# Patient Record
Sex: Female | Born: 1960 | Race: White | Hispanic: No | Marital: Married | Smoking: Never smoker
Health system: Southern US, Community
[De-identification: ages and names within clinical notes are randomized; demographics above are authoritative.]

## PROBLEM LIST (undated history)

## (undated) DIAGNOSIS — J302 Other seasonal allergic rhinitis: Secondary | ICD-10-CM

## (undated) DIAGNOSIS — C50919 Malignant neoplasm of unspecified site of unspecified female breast: Secondary | ICD-10-CM

## (undated) DIAGNOSIS — G43909 Migraine, unspecified, not intractable, without status migrainosus: Secondary | ICD-10-CM

## (undated) DIAGNOSIS — F419 Anxiety disorder, unspecified: Secondary | ICD-10-CM

## (undated) DIAGNOSIS — D219 Benign neoplasm of connective and other soft tissue, unspecified: Secondary | ICD-10-CM

## (undated) DIAGNOSIS — I2699 Other pulmonary embolism without acute cor pulmonale: Secondary | ICD-10-CM

## (undated) DIAGNOSIS — I82409 Acute embolism and thrombosis of unspecified deep veins of unspecified lower extremity: Secondary | ICD-10-CM

## (undated) DIAGNOSIS — F32A Depression, unspecified: Secondary | ICD-10-CM

## (undated) DIAGNOSIS — M1990 Unspecified osteoarthritis, unspecified site: Secondary | ICD-10-CM

## (undated) DIAGNOSIS — N200 Calculus of kidney: Secondary | ICD-10-CM

## (undated) DIAGNOSIS — D649 Anemia, unspecified: Secondary | ICD-10-CM

## (undated) HISTORY — DX: Anemia, unspecified: D64.9

## (undated) HISTORY — PX: WISDOM TOOTH EXTRACTION: SHX21

## (undated) HISTORY — DX: Other pulmonary embolism without acute cor pulmonale: I26.99

## (undated) HISTORY — DX: Acute embolism and thrombosis of unspecified deep veins of unspecified lower extremity: I82.409

## (undated) HISTORY — PX: TUBAL LIGATION: SHX77

## (undated) HISTORY — DX: Migraine, unspecified, not intractable, without status migrainosus: G43.909

## (undated) HISTORY — DX: Malignant neoplasm of unspecified site of unspecified female breast: C50.919

## (undated) HISTORY — DX: Benign neoplasm of connective and other soft tissue, unspecified: D21.9

## (undated) HISTORY — DX: Depression, unspecified: F32.A

## (undated) HISTORY — DX: Anxiety disorder, unspecified: F41.9

---

## 1987-07-09 DIAGNOSIS — I2699 Other pulmonary embolism without acute cor pulmonale: Secondary | ICD-10-CM

## 1987-07-09 HISTORY — DX: Other pulmonary embolism without acute cor pulmonale: I26.99

## 2010-04-19 ENCOUNTER — Other Ambulatory Visit: Admission: RE | Admit: 2010-04-19 | Discharge: 2010-04-19 | Payer: Self-pay | Admitting: Gynecology

## 2010-04-19 ENCOUNTER — Ambulatory Visit: Payer: Self-pay | Admitting: Gynecology

## 2010-04-26 ENCOUNTER — Ambulatory Visit: Payer: Self-pay | Admitting: Gynecology

## 2010-05-17 ENCOUNTER — Ambulatory Visit: Payer: Self-pay | Admitting: Gynecology

## 2010-05-17 ENCOUNTER — Ambulatory Visit (HOSPITAL_BASED_OUTPATIENT_CLINIC_OR_DEPARTMENT_OTHER): Admission: RE | Admit: 2010-05-17 | Discharge: 2010-05-17 | Payer: Self-pay | Admitting: Gynecology

## 2010-09-18 LAB — POCT HEMOGLOBIN-HEMACUE: Hemoglobin: 10.5 g/dL — ABNORMAL LOW (ref 12.0–15.0)

## 2011-12-30 ENCOUNTER — Ambulatory Visit (INDEPENDENT_AMBULATORY_CARE_PROVIDER_SITE_OTHER): Payer: Managed Care, Other (non HMO) | Admitting: Gynecology

## 2011-12-30 ENCOUNTER — Encounter: Payer: Self-pay | Admitting: Gynecology

## 2011-12-30 ENCOUNTER — Other Ambulatory Visit (HOSPITAL_COMMUNITY)
Admission: RE | Admit: 2011-12-30 | Discharge: 2011-12-30 | Disposition: A | Discharge status: Home / Self Care | Payer: Managed Care, Other (non HMO) | Source: Ambulatory Visit | Attending: Gynecology | Admitting: Gynecology

## 2011-12-30 VITALS — BP 120/60 | Ht 64.0 in | Wt 173.0 lb

## 2011-12-30 DIAGNOSIS — Z13220 Encounter for screening for lipoid disorders: Secondary | ICD-10-CM

## 2011-12-30 DIAGNOSIS — Z01419 Encounter for gynecological examination (general) (routine) without abnormal findings: Secondary | ICD-10-CM

## 2011-12-30 DIAGNOSIS — N840 Polyp of corpus uteri: Secondary | ICD-10-CM | POA: Insufficient documentation

## 2011-12-30 DIAGNOSIS — G43909 Migraine, unspecified, not intractable, without status migrainosus: Secondary | ICD-10-CM | POA: Insufficient documentation

## 2011-12-30 DIAGNOSIS — Z131 Encounter for screening for diabetes mellitus: Secondary | ICD-10-CM

## 2011-12-30 DIAGNOSIS — Z1159 Encounter for screening for other viral diseases: Secondary | ICD-10-CM | POA: Insufficient documentation

## 2011-12-30 DIAGNOSIS — N920 Excessive and frequent menstruation with regular cycle: Secondary | ICD-10-CM

## 2011-12-30 DIAGNOSIS — D259 Leiomyoma of uterus, unspecified: Secondary | ICD-10-CM

## 2011-12-30 LAB — CBC WITH DIFFERENTIAL/PLATELET
Basophils Relative: 1 % (ref 0–1)
HCT: 31 % — ABNORMAL LOW (ref 36.0–46.0)
Hemoglobin: 9.1 g/dL — ABNORMAL LOW (ref 12.0–15.0)
Lymphs Abs: 1.5 10*3/uL (ref 0.7–4.0)
MCHC: 29.4 g/dL — ABNORMAL LOW (ref 30.0–36.0)
Monocytes Absolute: 0.4 10*3/uL (ref 0.1–1.0)
Monocytes Relative: 7 % (ref 3–12)
Neutro Abs: 4.6 10*3/uL (ref 1.7–7.7)
RBC: 4.66 MIL/uL (ref 3.87–5.11)

## 2011-12-30 LAB — LIPID PANEL
Cholesterol: 177 mg/dL (ref 0–200)
HDL: 46 mg/dL (ref >39)
Total CHOL/HDL Ratio: 3.8 Ratio
Triglycerides: 119 mg/dL (ref <150)
VLDL: 24 mg/dL (ref 0–40)

## 2011-12-30 NOTE — Progress Notes (Signed)
TARISHA FADER 31-Jan-1961 [id number]        51 y.o.  for annual exam.  Several issues noted below.  Past medical history,surgical history, medications, allergies, family history and social history were all reviewed and documented in the EPIC chart. ROS:  Was performed and pertinent positives and negatives are included in the history.  Exam: Kim Asst. present Filed Vitals:   12/30/11 1102  BP: 120/60   General appearance  Normal Skin grossly normal Head/Neck normal with no cervical or supraclavicular adenopathy thyroid normal Lungs  clear Cardiac RR, without RMG Abdominal  soft, nontender, without masses, organomegaly or hernia Breasts  examined lying and sitting without masses, retractions, discharge or axillary adenopathy. Pelvic  Ext/BUS/vagina  normal   Cervix  normal   Uterus  Bulky irregular midline and mobile nontender   Adnexa  Without masses or tenderness    Anus and perineum  normal   Rectovaginal  normal sphincter tone without palpated masses or tenderness.    Assessment/Plan:  51 y.o. female for annual exam.    1. Leiomyoma. Uterus appears to be larger than previously on exam. Repeat ultrasound now for comparison to her ultrasound in 2011. Menorrhagia continues. Options for management reviewed to include trial of Depo-Lupron given her age and hopefully getting her through menopause. Alternatives such as endometrial ablation up to and including hysterectomy reviewed. Literature on HerOption endometrial ablation given.  Patient will follow for sonohysterogram and we'll go from there. 2. Anemia. Patient has a history of significant iron deficiency anemia. She is on iron several times weekly. We'll check CBC now. 3. Pap smear. Pap/HPV done today. No history of abnormal Pap smears before but only Pap smear from 2011 in her chart. 4. Mammography. Patient had her mammogram done today. SBE monthly reviewed. 5. Colonoscopy. We'll plan colonoscopy over the next year or  two. 6. Health maintenance. Will check baseline CBC lipid profile glucose urinalysis along with her ultrasound study.    Dara Lords MD, 12:02 PM 12/30/2011

## 2011-12-30 NOTE — Patient Instructions (Signed)
Follow up for ultrasound as scheduled 

## 2011-12-31 LAB — URINALYSIS W MICROSCOPIC + REFLEX CULTURE
Bacteria, UA: NONE SEEN
Bilirubin Urine: NEGATIVE
Ketones, ur: NEGATIVE mg/dL
Protein, ur: NEGATIVE mg/dL
Urobilinogen, UA: 0.2 mg/dL (ref 0.0–1.0)

## 2012-01-02 ENCOUNTER — Encounter: Payer: Self-pay | Admitting: Gynecology

## 2012-01-17 ENCOUNTER — Ambulatory Visit (INDEPENDENT_AMBULATORY_CARE_PROVIDER_SITE_OTHER): Payer: Managed Care, Other (non HMO) | Admitting: Gynecology

## 2012-01-17 ENCOUNTER — Encounter: Payer: Self-pay | Admitting: Gynecology

## 2012-01-17 ENCOUNTER — Other Ambulatory Visit: Payer: Self-pay | Admitting: Gynecology

## 2012-01-17 ENCOUNTER — Ambulatory Visit (INDEPENDENT_AMBULATORY_CARE_PROVIDER_SITE_OTHER): Payer: Managed Care, Other (non HMO)

## 2012-01-17 DIAGNOSIS — N920 Excessive and frequent menstruation with regular cycle: Secondary | ICD-10-CM

## 2012-01-17 DIAGNOSIS — N949 Unspecified condition associated with female genital organs and menstrual cycle: Secondary | ICD-10-CM

## 2012-01-17 DIAGNOSIS — N938 Other specified abnormal uterine and vaginal bleeding: Secondary | ICD-10-CM

## 2012-01-17 DIAGNOSIS — D259 Leiomyoma of uterus, unspecified: Secondary | ICD-10-CM

## 2012-01-17 NOTE — Progress Notes (Signed)
Patient presents for sonohysterogram due to history of leiomyoma and continued menorrhagia. She has a history of a submucous myoma status post hysteroscopic resection November 2011. Her menorrhagia has continued.  Ultrasound shows multiple small myomas the largest 27 mm. Right and left ovaries normal. Endometrial echo 8.5 mm. No free fluid noted. Sonohysterogram performed, sterile technique, single-tooth tenaculum anterior lip needed for catheter placement, good distention with intracavitary abnormality suspicious for polyp versus submucous myoma. Endometrial sample taken. Patient tolerated well.  Assessment and plan:  Menorrhagia, multiple small myomas, questionable submucous myoma versus polyp. Reviewed with patient, I think this is probably a submucous myoma. She is status post resection one and a half years ago. Options for management for her menorrhagia reviewed to include observation, attempted hormonal manipulation, rehysteroscopy with resection of defects with or without simultaneous endometrial ablation, hysterectomy. My recommendation would be hysteroscopy resection with endometrial ablation or hysterectomy. Patient wants to think about options and will follow up with [place] when we call her with her biopsy results. If she plans no intervention I did review with her I cannot guarantee that this defect on ultrasound is not premalignant or malignant but given her total picture I would highly doubt this.

## 2012-01-17 NOTE — Patient Instructions (Signed)
Office will call you with the biopsy results. We will discuss your decision as far as treatment options.

## 2012-01-23 ENCOUNTER — Telehealth: Payer: Self-pay | Admitting: Gynecology

## 2012-01-23 NOTE — Telephone Encounter (Signed)
Spoke with pt regarding the below note, pt said you spoke with her about having D&C with ablation. Pt would like to just ablation done if possible. Please advise

## 2012-01-23 NOTE — Telephone Encounter (Signed)
Patient does have a small defect in the cavity that I think is another fibroid although could be a small polyp. As the endometrial biopsy was normal my concern that something serious like precancerous changes is very low.  I think observation is okay if she accepts putting up with the bleeding at least at this point. If it becomes more significant or starts having intermenstrual bleeding then she is to let me know. If she wants to pursue one of the other options such as hysteroscopy/ablation or even hysterectomy done have her give me a call.

## 2012-01-23 NOTE — Telephone Encounter (Signed)
I think ablation would be a possibility without hysteroscopy. There are issues to discuss is certainly a possibility. If she wants to pursue that I would precertified to make sure that she is comfortable financially with proceeding and then we can discuss any preoperative appointment.

## 2012-01-24 ENCOUNTER — Telehealth: Payer: Self-pay | Admitting: Gynecology

## 2012-01-24 NOTE — Telephone Encounter (Signed)
Pt informed with the below note. Will send to ka to check financial benefits.

## 2012-01-24 NOTE — Telephone Encounter (Signed)
Patient informed her insurance covers Her Option ablation when medically indicated with a $50 co-payment.   Patient is ready to schedule and this is okay with Dr. Velvet Bathe.  Patient knows they will talk more about it and the issues surrounding it at her pre-op consult.  We discussed her LMP was 7/9-7/18 and she anticipates her next period around August 9-11.  We are tentatively looking at Aug 26 as she would like a Friday and this will be week after she moves her son in at college.  I am holding the time and she is going to talk with her husband this weekend as she understands she will need someone to drive her to and from procedure.  She will call me next week and we will firm up date/details and schedule her a pre-op consult.

## 2012-01-27 ENCOUNTER — Telehealth: Payer: Self-pay | Admitting: Gynecology

## 2012-01-27 NOTE — Telephone Encounter (Signed)
Patient and I had talked on Friday and I was holding time for Her Option Ablation on Friday, August 26. She was going to check with husband over weekend and call me today.  She did call me this morning but she said that she wanted to postpone surgery.  She does not want 8/26 date and plans to postpone for a couple of months.  She said she really thinks what she needs to do is the [facility], Hysteroscopy but not ready to do that at present.  She said she is going to observe for a few months and she will call when ready.

## 2013-04-02 ENCOUNTER — Encounter: Payer: Self-pay | Admitting: Gynecology

## 2013-04-02 ENCOUNTER — Ambulatory Visit (INDEPENDENT_AMBULATORY_CARE_PROVIDER_SITE_OTHER): Payer: Managed Care, Other (non HMO) | Admitting: Gynecology

## 2013-04-02 VITALS — BP 116/78 | Ht 63.0 in | Wt 178.0 lb

## 2013-04-02 DIAGNOSIS — Z13220 Encounter for screening for lipoid disorders: Secondary | ICD-10-CM

## 2013-04-02 DIAGNOSIS — Z01419 Encounter for gynecological examination (general) (routine) without abnormal findings: Secondary | ICD-10-CM

## 2013-04-02 DIAGNOSIS — N926 Irregular menstruation, unspecified: Secondary | ICD-10-CM

## 2013-04-02 MED ORDER — MEDROXYPROGESTERONE ACETATE 10 MG PO TABS: 10.0000 mg | ORAL_TABLET | Freq: Every day | ORAL | Status: DC

## 2013-04-02 NOTE — Patient Instructions (Addendum)
Followup for ultrasound as scheduled.  Take the progesterone pill daily for 10 days.  Schedule colonoscopy with [facility] gastroenterology at [phone] or [facility] gastroenterology at [phone]   Call to Schedule your mammogram  Facilities in [place]: 1)  [facility] [place], [place] Phone: [phone] 2)  [facility], [id number] Phone: [phone] 3)  Dr. Yolanda Bonine at [facility] [address] Phone: [phone]     Mammogram A mammogram is an X-ray test to find changes in a woman's breast. You should get a mammogram if:  You are 20 years of age or older  You have risk factors.   Your doctor recommends that you have one.  BEFORE THE TEST  Do not schedule the test the week before your period, especially if your breasts are sore during this time.  On the day of your mammogram:  Wash your breasts and armpits well. After washing, do not put on any deodorant or talcum powder on until after your test.   Eat and drink as you usually do.   Take your medicines as usual.   If you are diabetic and take insulin, make sure you:   Eat before coming for your test.   Take your insulin as usual.   If you cannot keep your appointment, call before the appointment to cancel. Schedule another appointment.  TEST  You will need to undress from the waist up. You will put on a hospital gown.   Your breast will be put on the mammogram machine, and it will press firmly on your breast with a piece of plastic called a compression paddle. This will make your breast flatter so that the machine can X-ray all parts of your breast.   Both breasts will be X-rayed. Each breast will be X-rayed from above and from the side. An X-ray might need to be taken again if the picture is not good enough.   The mammogram will last about 15 to 30 minutes.  AFTER THE TEST Finding out the results of  your test Ask when your test results will be ready. Make sure you get your test results.  Document Released: 09/20/2008 Document Revised: 06/13/2011 Document Reviewed: 09/20/2008 [facility] Patient Information 2012 [place], [place].

## 2013-04-02 NOTE — Progress Notes (Signed)
Theresa Castro 1960/10/13 [id number]        52 y.o.  G3P3003 for annual exam.  Several issues below.  Past medical history,surgical history, medications, allergies, family history and social history were all reviewed and documented in the EPIC chart.  ROS:  Performed and pertinent positives and negatives are included in the history, assessment and plan .  Exam: Kim assistant Filed Vitals:   04/02/13 1401  BP: 116/78  Height: 5\' 3"  (1.6 m)  Weight: 178 lb (80.74 kg)   General appearance  Normal Skin grossly normal Head/Neck normal with no cervical or supraclavicular adenopathy thyroid normal Lungs  clear Cardiac RR, without RMG Abdominal  soft, nontender, without masses, organomegaly or hernia Breasts  examined lying and sitting without masses, retractions, discharge or axillary adenopathy. Pelvic  Ext/BUS/vagina  normal  Cervix  normal  Uterus  8 weeks size irregular, midline and mobile nontender   Adnexa  Without masses or tenderness    Anus and perineum  normal   Rectovaginal  normal sphincter tone without palpated masses or tenderness.    Assessment/Plan:  52 y.o. G49P3003 female for annual exam.   1. Menorrhagia/irregular menses. Patient's heavy menses continue. For the last month she's bled on and off continuously. Sonohysterogram last year showed questionable endometrial polyp or submucous myoma and biopsy showed secretory endometrium.  Options for management reviewed to include hormonal manipulation, Mirena IUD, endometrial ablation, hysteroscopy D&C resection of defects with or without ablation at the same time, hysterectomy uterine artery embolization and myomectomy.  She does have a history of pulmonary embolus in the immediate postoperative period from her cesarean section. The issues of progesterone use with his history and risk of DVT/embolus discussed. Recommend baseline sonohysterogram now to better define the cavity and then further discuss options afterwards and she  agrees with this. Will check FSH also now to see where we stand from a menopausal standpoint. Her mother did undergo menopause in her late 47s. 2. Pap/HPV negative 2013. No Pap smear done today. No history of abnormal Pap smears previously. Plan repeat in 3-5 year interval. 3. Mammography 12/2011. Patient knows she is overdue and plans to schedule. SBE monthly reviewed. 4. Colonoscopy never. Recommend screening colonoscopy this year and she agrees to arrange. 5. Health maintenance. Baseline CBC comprehensive metabolic panel lipid profile urinalysis ordered along with her TSH FSH. Followup for sonohysterogram.  Note: This document was prepared with digital dictation and possible smart phrase technology. Any transcriptional errors that result from this process are unintentional.   Dara Lords MD, 2:31 PM 04/02/2013

## 2013-04-05 ENCOUNTER — Encounter: Payer: Self-pay | Admitting: Gynecology

## 2013-04-07 ENCOUNTER — Other Ambulatory Visit: Payer: Self-pay | Admitting: Gynecology

## 2013-04-07 DIAGNOSIS — N926 Irregular menstruation, unspecified: Secondary | ICD-10-CM

## 2013-04-08 ENCOUNTER — Encounter: Payer: Self-pay | Admitting: Gynecology

## 2013-04-12 ENCOUNTER — Telehealth: Payer: Self-pay | Admitting: *Deleted

## 2013-04-12 MED ORDER — MEGESTROL ACETATE 20 MG PO TABS: 20.0000 mg | ORAL_TABLET | Freq: Two times a day (BID) | ORAL | Status: DC

## 2013-04-12 NOTE — Telephone Encounter (Signed)
Rx sent, pt informed. 

## 2013-04-12 NOTE — Telephone Encounter (Signed)
Pt schedule for [facility] on 04/14/13 pt said she is still bleeding, pt took provera 10 mg daily as directed x 10 day from OV 04/02/13. Changing pad and tampon every 2 hours, clots. She would like to know keep [facility] appointment with bleeding or if Rx will be giving to help with bleeding.? Please advise

## 2013-04-12 NOTE — Telephone Encounter (Signed)
Keep sonohysterogram appointment regardless. Start Megace 20 mg twice a day now #20

## 2013-04-14 ENCOUNTER — Ambulatory Visit (INDEPENDENT_AMBULATORY_CARE_PROVIDER_SITE_OTHER): Payer: Managed Care, Other (non HMO)

## 2013-04-14 ENCOUNTER — Encounter: Payer: Self-pay | Admitting: Gynecology

## 2013-04-14 ENCOUNTER — Ambulatory Visit (INDEPENDENT_AMBULATORY_CARE_PROVIDER_SITE_OTHER): Payer: Managed Care, Other (non HMO) | Admitting: Gynecology

## 2013-04-14 DIAGNOSIS — N840 Polyp of corpus uteri: Secondary | ICD-10-CM

## 2013-04-14 DIAGNOSIS — D251 Intramural leiomyoma of uterus: Secondary | ICD-10-CM

## 2013-04-14 DIAGNOSIS — D259 Leiomyoma of uterus, unspecified: Secondary | ICD-10-CM

## 2013-04-14 DIAGNOSIS — N920 Excessive and frequent menstruation with regular cycle: Secondary | ICD-10-CM

## 2013-04-14 DIAGNOSIS — N926 Irregular menstruation, unspecified: Secondary | ICD-10-CM

## 2013-04-14 DIAGNOSIS — N852 Hypertrophy of uterus: Secondary | ICD-10-CM

## 2013-04-14 DIAGNOSIS — N921 Excessive and frequent menstruation with irregular cycle: Secondary | ICD-10-CM

## 2013-04-14 DIAGNOSIS — N8300 Follicular cyst of ovary, unspecified side: Secondary | ICD-10-CM

## 2013-04-14 NOTE — Patient Instructions (Signed)
Office will call you with biopsy results. We will move toward scheduling surgery as we discussed.

## 2013-04-14 NOTE — Progress Notes (Signed)
Patient presents for sonohysterogram due to persistent menorrhagia. Questionable polyp or submucous myoma on sonohysterogram last year.  Ultrasound shows uterus is generous in size. Endometrial echo 25 mm. Small intra-mural myoma noted at 12 mm heterogeneous endometrial cavity with feeder vessel. Right left ovaries normal. Cul-de-sac negative.  Sonohysterogram performed, sterile technique, easy catheter introduction, good distention with anterior defect 35 x 34 x 17 mm. Endometrial sample taken. Patient tolerated well.  Assessment and plan: Probable endometrial polyp sessile against anterior wall. Recommend proceeding with hysteroscopy D&C removal of this area. I reviewed with what is involved with the procedure noting that she has undergone this before. Instrumentation use of the resectoscope and D&C portion discussed. Risks of bleeding, transfusion, infection with prolonged antibiotics, uterine perforation, damage to internal organs including bowel bladder ureters vessels and nerves necessitating major exploratory reparative surgeries and future reparative surgeries including ostomy formation our section bladder repair ureteral damage repair as well as distending media distortion causing metabolic complications such as, and seizures discussed. The patient asked me about proceeding with ablation at the same time. Reviewed with her that this would not allow for final pathology from the [facility] specimen and that if significant atypia is encountered that this could complicate further management such as resampling in the future and that may back [place] into a corner as far as hysterectomy is concerned. Understanding and accepting this issue she does want to proceed with ablation at the same time and assuming that the endometrial sample today is benign I think this is reasonable with the above disclaimer. We'll go ahead and move toward scheduling the surgery and we'll go from there.

## 2013-04-16 ENCOUNTER — Other Ambulatory Visit: Payer: Self-pay | Admitting: Gynecology

## 2013-04-16 ENCOUNTER — Telehealth: Payer: Self-pay

## 2013-04-16 MED ORDER — ENOXAPARIN SODIUM 40 MG/0.4ML ~~LOC~~ SOLN: SUBCUTANEOUS | Status: DC

## 2013-04-16 MED ORDER — MISOPROSTOL 200 MCG PO TABS: ORAL_TABLET | ORAL | Status: DC

## 2013-04-16 NOTE — Telephone Encounter (Signed)
I spoke with patient and scheduled her surgery for Nov 3 7:30am .  She was instructed regarded Cytotec tab vaginally night before surgery as well as Lovenox 40 mg to be administered sub-q 2 hours preop and daily x 7 d  Post op.  She opts to administer the pre op dose at home before coming to the hospital.  Rx's called in.  Preop scheduled for 10.27.14 11:30am.

## 2013-04-27 ENCOUNTER — Encounter (HOSPITAL_COMMUNITY): Payer: Self-pay | Admitting: Pharmacist

## 2013-05-03 ENCOUNTER — Encounter (HOSPITAL_COMMUNITY): Payer: Self-pay

## 2013-05-03 ENCOUNTER — Ambulatory Visit (INDEPENDENT_AMBULATORY_CARE_PROVIDER_SITE_OTHER): Payer: Managed Care, Other (non HMO) | Admitting: Gynecology

## 2013-05-03 ENCOUNTER — Encounter: Payer: Self-pay | Admitting: Gynecology

## 2013-05-03 ENCOUNTER — Encounter (HOSPITAL_COMMUNITY)
Admission: RE | Admit: 2013-05-03 | Discharge: 2013-05-03 | Disposition: A | Discharge status: Home / Self Care | Payer: Managed Care, Other (non HMO) | Source: Ambulatory Visit | Attending: Gynecology | Admitting: Gynecology

## 2013-05-03 DIAGNOSIS — Z01812 Encounter for preprocedural laboratory examination: Secondary | ICD-10-CM | POA: Insufficient documentation

## 2013-05-03 DIAGNOSIS — Z01818 Encounter for other preprocedural examination: Secondary | ICD-10-CM | POA: Insufficient documentation

## 2013-05-03 DIAGNOSIS — N920 Excessive and frequent menstruation with regular cycle: Secondary | ICD-10-CM

## 2013-05-03 HISTORY — DX: Other seasonal allergic rhinitis: J30.2

## 2013-05-03 HISTORY — DX: Unspecified osteoarthritis, unspecified site: M19.90

## 2013-05-03 HISTORY — DX: Calculus of kidney: N20.0

## 2013-05-03 LAB — CBC
Hemoglobin: 10.1 g/dL — ABNORMAL LOW (ref 12.0–15.0)
Platelets: 338 10*3/uL (ref 150–400)
RBC: 4.51 MIL/uL (ref 3.87–5.11)
WBC: 6.2 10*3/uL (ref 4.0–10.5)

## 2013-05-03 NOTE — Patient Instructions (Addendum)
   Your procedure is scheduled on: Monday, Nov 3  Enter through the [employer] of [facility] at:  6 am Pick up the phone at the desk and dial [phone] and inform us of your arrival.  Please call this number if you have any problems the morning of surgery: [phone]  Remember: Do not eat or drink after midnight: Sunday Take these medicines the morning of surgery with a SIP OF WATER:  None   Do not wear jewelry, make-up, or FINGER nail polish No metal in your hair or on your body. Do not wear lotions, powders, perfumes. You may wear deodorant.  Please use your CHG wash as directed prior to surgery.  Do not shave anywhere for at least 12 hours prior to first CHG shower.  Do not bring valuables to the hospital. Contacts, dentures or bridgework may not be worn into surgery.  Patients discharged on the day of surgery will not be allowed to drive home.   Home with husband Theresa Castro.

## 2013-05-03 NOTE — Patient Instructions (Signed)
Followup for surgery as scheduled. 

## 2013-05-03 NOTE — H&P (Signed)
Theresa Castro 18-Jan-1961 [id number]   History and Physical  Chief complaint: Menorrhagia  History of present illness: 52 y.o. G3P3003 with long history of menorrhagia. Status post hysteroscopic submucous myomectomy 2011. With continued menorrhagia afterwards. Recent sonohysterogram shows multiple small intramural myomas with a probable anterior sessile endometrial polyp. Endometrial sample showed proliferative endometrium. Options for management of her continued menorrhagia discussed to include hormonal manipulation, hysteroscopy D&C removal of intra-cavitary defects, endometrial ablation and hysterectomy. Given the multiple small myomas do not think multiple myomectomies or uterine artery embolization reasonable.  Patient wants to proceed with hysteroscopic resection probable endometrial polyp with endometrial ablation.  Past medical history,surgical history, medications, allergies, family history and social history were all reviewed and documented in the EPIC chart. ROS:  Was performed and pertinent positives and negatives are included in the history of present illness.  Exam:  Kim assistant General: well developed, well nourished female, no acute distress HEENT: normal  Lungs: clear to auscultation without wheezing, rales or rhonchi  Cardiac: regular rate without rubs, murmurs or gallops  Abdomen: soft, nontender without masses, guarding, rebound, organomegaly  Pelvic: external bus vagina: normal   Cervix: grossly normal  Uterus: Anteverted, generous in size midline mobile nontender Adnexa: without masses or tenderness     Assessment/Plan:  52 y.o. [id number] multiple small myomas, continued menorrhagia despite hysteroscopic resection of submucous myoma. Most recently sonohysterogram suggestive of an anterior sessile polyp. Options for management were reviewed as outlined above. Patient wants to proceed with hysteroscopic resection endometrial polyp D&C and endometrial ablation. The  patient does understand that we will not have the final pathology from the [facility] polypectomy and if significant atypia that this may then force our hand to proceed with hysterectomy as after the ablation we may not be able to satisfactorily evaluate the cavity. Options to stepwise do the hysteroscopy D&C first and then ablation assuming pathology appropriate discussed with the patient wants to proceed with the combined procedure clearly understanding the issues. The pathology from her most recent sonohysterogram showed benign proliferative endometrium.  I reviewed what is involved with the procedure to include the intraoperative/postoperative courses and recovery period. Use of the hysteroscope, resectoscope and ablation equipment discussed. Tentatively we'll plan on ThermaChoice ablation and Truclear resection. Patient understands she should never pursue pregnancy following the ablation despite her tubal sterilization and no guarantees as far as menorrhagia relief were made and she understands there are failures and that her periods may continue heavy or worsen. The risks of infection, prolonged antibiotics, hemorrhage necessitating transfusion and the risks of transfusion including transfusion reaction, hepatitis, HIV, mad cow disease and other unknown entities were reviewed. The risk of uterine perforation and damage to internal organs including bowel, bladder, ureters, vessels and nerves necessitating major exploratory reparative surgeries and future reparative surgeries including bowel resection, ostomy formation, bladder repair ureteral damage repair was all discussed understood and accepted. Distended media absorption leading to metabolic complications such as coma and seizures or fluid overload was also discussed with her. She does have a history of pulmonary embolus postpartum after her cesarean section and we will plan on Lovenox 40 mg prophylaxis for 7 days postoperative. Patient's questions were answered  to her satisfaction she is ready to proceed with surgery.   Note: This document was prepared with digital dictation and possible smart phrase technology. Any transcriptional errors that result from this process are unintentional.  Dara Lords MD, 11:45 AM 05/03/2013

## 2013-05-03 NOTE — Progress Notes (Signed)
Theresa Castro [birth year] [id number]   Preoperative consult  Chief complaint: Menorrhagia  History of present illness: 52 y.o. G3P3003 with long history of menorrhagia. Status post hysteroscopic submucous myomectomy 2011. With continued menorrhagia afterwards. Recent sonohysterogram shows multiple small intramural myomas with a probable anterior sessile endometrial polyp. Endometrial sample showed proliferative endometrium. Options for management of her continued menorrhagia discussed to include hormonal manipulation, hysteroscopy D&C removal of intra-cavitary defects, endometrial ablation and hysterectomy. Given the multiple small myomas do not think multiple myomectomies or uterine artery embolization reasonable.  Patient wants to proceed with hysteroscopic resection probable endometrial polyp with endometrial ablation.  Past medical history,surgical history, medications, allergies, family history and social history were all reviewed and documented in the EPIC chart. ROS:  Was performed and pertinent positives and negatives are included in the history of present illness.  Exam:  Kim assistant General: well developed, well nourished female, no acute distress HEENT: normal  Lungs: clear to auscultation without wheezing, rales or rhonchi  Cardiac: regular rate without rubs, murmurs or gallops  Abdomen: soft, nontender without masses, guarding, rebound, organomegaly  Pelvic: external bus vagina: normal   Cervix: grossly normal  Uterus: Anteverted, generous in size midline mobile nontender Adnexa: without masses or tenderness     Assessment/Plan:  52 y.o. [id number] multiple small myomas, continued menorrhagia despite hysteroscopic resection of submucous myoma. Most recently sonohysterogram suggestive of an anterior sessile polyp. Options for management were reviewed as outlined above. Patient wants to proceed with hysteroscopic resection endometrial polyp D&C and endometrial ablation. The patient  does understand that we will not have the final pathology from the [facility] polypectomy and if significant atypia that this may then force our hand to proceed with hysterectomy as after the ablation we may not be able to satisfactorily evaluate the cavity. Options to stepwise do the hysteroscopy D&C first and then ablation assuming pathology appropriate discussed with the patient wants to proceed with the combined procedure clearly understanding the issues. The pathology from her most recent sonohysterogram showed benign proliferative endometrium.  I reviewed with involved with the procedure to include the intraoperative/postoperative courses and recovery period. Use of the hysteroscope, resectoscope and ablation equipment discussed. Tentatively we'll plan on ThermaChoice ablation and Truclear resection. Patient understands she should never pursue pregnancy following the ablation despite her tubal sterilization and no guarantees as far as menorrhagia relief were made and she understands there are failures and that her periods may continue heavy or worsen. The risks of infection, prolonged antibiotics, hemorrhage necessitating transfusion and the risks of transfusion including transfusion reaction, hepatitis, HIV, mad cow disease and other unknown entities were reviewed. The risk of uterine perforation and damage to internal organs including bowel, bladder, ureters, vessels and nerves necessitating major exploratory reparative surgeries and future reparative surgeries including bowel resection, ostomy formation, bladder repair ureteral damage repair was all discussed understood and accepted. Distended media absorption leading to metabolic complications such as coma and seizures or fluid overload was also discussed with her. She does have a history of pulmonary embolus postpartum after her cesarean section and we will plan on Lovenox 40 mg prophylaxis for 7 days postoperative. Patient's questions were answered to her  satisfaction she is ready to proceed with surgery.   Note: This document was prepared with digital dictation and possible smart phrase technology. Any transcriptional errors that result from this process are unintentional.  Dara Lords MD, 11:34 AM 05/03/2013

## 2013-05-04 ENCOUNTER — Other Ambulatory Visit: Payer: Self-pay | Admitting: Gynecology

## 2013-05-04 ENCOUNTER — Telehealth: Payer: Self-pay

## 2013-05-04 MED ORDER — ENOXAPARIN SODIUM 40 MG/0.4ML ~~LOC~~ SOLN: SUBCUTANEOUS | Status: DC

## 2013-05-04 NOTE — Telephone Encounter (Signed)
I called patient to let her know I called her Lovenox in.  I did call it in on Oct 10. 2014 but when she went to pharmacy they said they did not have it.  I had left a detailed message on voicemail at pharmacy so today I actually spoke with the pharmacist and gave him the Rx.  While talking with patient she asked again about time out of work. 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  Arline Asp has taken Mon, Tu, Weds off and her surgery is Mon.  She will return to work [employer] and I told her she should be fine to return then.  I told her Dr. Velvet Bathe would support her being out that week but she is motivated and wants to get back on Thursday.

## 2013-05-09 MED ORDER — ENOXAPARIN SODIUM 40 MG/0.4ML ~~LOC~~ SOLN
40.0000 mg | SUBCUTANEOUS | Status: DC
  Filled 2013-05-09: qty 0.4

## 2013-05-09 MED ORDER — DEXTROSE 5 % IV SOLN
2.0000 g | INTRAVENOUS | Status: AC
  Administered 2013-05-10: 2 g via INTRAVENOUS
  Filled 2013-05-09: qty 2

## 2013-05-10 ENCOUNTER — Ambulatory Visit (HOSPITAL_COMMUNITY)
Admission: RE | Admit: 2013-05-10 | Discharge: 2013-05-10 | Disposition: A | Discharge status: Home / Self Care | Payer: Managed Care, Other (non HMO) | Source: Ambulatory Visit | Attending: Gynecology | Admitting: Gynecology

## 2013-05-10 ENCOUNTER — Encounter (HOSPITAL_COMMUNITY): Payer: Managed Care, Other (non HMO) | Admitting: Anesthesiology

## 2013-05-10 ENCOUNTER — Encounter (HOSPITAL_COMMUNITY)
Admission: RE | Disposition: A | Discharge status: Home / Self Care | Payer: Self-pay | Source: Ambulatory Visit | Attending: Gynecology

## 2013-05-10 ENCOUNTER — Ambulatory Visit (HOSPITAL_COMMUNITY): Payer: Managed Care, Other (non HMO) | Admitting: Anesthesiology

## 2013-05-10 DIAGNOSIS — N840 Polyp of corpus uteri: Secondary | ICD-10-CM | POA: Insufficient documentation

## 2013-05-10 DIAGNOSIS — N920 Excessive and frequent menstruation with regular cycle: Secondary | ICD-10-CM | POA: Insufficient documentation

## 2013-05-10 DIAGNOSIS — D250 Submucous leiomyoma of uterus: Secondary | ICD-10-CM | POA: Insufficient documentation

## 2013-05-10 DIAGNOSIS — Z86711 Personal history of pulmonary embolism: Secondary | ICD-10-CM | POA: Insufficient documentation

## 2013-05-10 HISTORY — PX: DILATATION & CURETTAGE/HYSTEROSCOPY WITH TRUECLEAR: SHX6353

## 2013-05-10 LAB — PREGNANCY, URINE: Preg Test, Ur: NEGATIVE

## 2013-05-10 SURGERY — DILATATION & CURETTAGE/HYSTEROSCOPY WITH TRUCLEAR
Anesthesia: General | Site: Vagina | Wound class: Clean Contaminated

## 2013-05-10 MED ORDER — FENTANYL CITRATE 0.05 MG/ML IJ SOLN
INTRAMUSCULAR | Status: DC | PRN
  Administered 2013-05-10 (×2): 50 ug via INTRAVENOUS

## 2013-05-10 MED ORDER — KETOROLAC TROMETHAMINE 30 MG/ML IJ SOLN
INTRAMUSCULAR | Status: AC
  Filled 2013-05-10: qty 1

## 2013-05-10 MED ORDER — LIDOCAINE HCL 1 % IJ SOLN
INTRAMUSCULAR | Status: DC | PRN
  Administered 2013-05-10: 10 mL

## 2013-05-10 MED ORDER — LACTATED RINGERS IV SOLN
INTRAVENOUS | Status: DC
  Administered 2013-05-10 (×2): via INTRAVENOUS

## 2013-05-10 MED ORDER — KETOROLAC TROMETHAMINE 30 MG/ML IJ SOLN: 15.0000 mg | Freq: Once | INTRAMUSCULAR | Status: DC | PRN

## 2013-05-10 MED ORDER — SODIUM CHLORIDE 0.9 % IR SOLN
Status: DC | PRN
  Administered 2013-05-10 (×2): 3000 mL

## 2013-05-10 MED ORDER — PROPOFOL 10 MG/ML IV EMUL
INTRAVENOUS | Status: AC
  Filled 2013-05-10: qty 20

## 2013-05-10 MED ORDER — FENTANYL CITRATE 0.05 MG/ML IJ SOLN
INTRAMUSCULAR | Status: AC
  Filled 2013-05-10: qty 5

## 2013-05-10 MED ORDER — LIDOCAINE HCL (CARDIAC) 20 MG/ML IV SOLN
INTRAVENOUS | Status: AC
  Filled 2013-05-10: qty 5

## 2013-05-10 MED ORDER — PROPOFOL 10 MG/ML IV BOLUS
INTRAVENOUS | Status: DC | PRN
  Administered 2013-05-10: 200 mg via INTRAVENOUS

## 2013-05-10 MED ORDER — METOCLOPRAMIDE HCL 5 MG/ML IJ SOLN: 10.0000 mg | Freq: Once | INTRAMUSCULAR | Status: DC | PRN

## 2013-05-10 MED ORDER — LIDOCAINE HCL (CARDIAC) 20 MG/ML IV SOLN
INTRAVENOUS | Status: DC | PRN
  Administered 2013-05-10: 80 mg via INTRAVENOUS

## 2013-05-10 MED ORDER — LIDOCAINE HCL 1 % IJ SOLN
INTRAMUSCULAR | Status: AC
  Filled 2013-05-10: qty 20

## 2013-05-10 MED ORDER — MIDAZOLAM HCL 2 MG/2ML IJ SOLN
INTRAMUSCULAR | Status: DC | PRN
  Administered 2013-05-10: 2 mg via INTRAVENOUS

## 2013-05-10 MED ORDER — ONDANSETRON HCL 4 MG/2ML IJ SOLN
INTRAMUSCULAR | Status: AC
  Filled 2013-05-10: qty 2

## 2013-05-10 MED ORDER — FENTANYL CITRATE 0.05 MG/ML IJ SOLN: 25.0000 ug | INTRAMUSCULAR | Status: DC | PRN

## 2013-05-10 MED ORDER — MIDAZOLAM HCL 2 MG/2ML IJ SOLN
INTRAMUSCULAR | Status: AC
  Filled 2013-05-10: qty 2

## 2013-05-10 MED ORDER — OXYCODONE-ACETAMINOPHEN 5-325 MG PO TABS: 1.0000 | ORAL_TABLET | ORAL | Status: DC | PRN

## 2013-05-10 MED ORDER — SILVER NITRATE-POT NITRATE 75-25 % EX MISC
CUTANEOUS | Status: DC | PRN
  Administered 2013-05-10: 1

## 2013-05-10 MED ORDER — ONDANSETRON HCL 4 MG/2ML IJ SOLN
INTRAMUSCULAR | Status: DC | PRN
  Administered 2013-05-10: 4 mg via INTRAVENOUS

## 2013-05-10 SURGICAL SUPPLY — 21 items
BLADE INCISOR TRUC PLUS 2.9 (ABLATOR) ×1 IMPLANT
CANISTERS HI-FLOW 3000CC (CANNISTER) ×6 IMPLANT
CATH ROBINSON RED A/P 16FR (CATHETERS) ×2 IMPLANT
CLOTH BEACON ORANGE TIMEOUT ST (SAFETY) ×2 IMPLANT
CONTAINER PREFILL 10% NBF 60ML (FORM) ×2 IMPLANT
DRAPE HYSTEROSCOPY (DRAPE) ×2 IMPLANT
DRESSING TELFA 8X3 (GAUZE/BANDAGES/DRESSINGS) ×4 IMPLANT
ELECT REM PT RETURN 9FT ADLT (ELECTROSURGICAL)
ELECTRODE REM PT RTRN 9FT ADLT (ELECTROSURGICAL) IMPLANT
GAUZE SPONGE 4X4 16PLY XRAY LF (GAUZE/BANDAGES/DRESSINGS) ×2 IMPLANT
GLOVE BIO SURGEON STRL SZ7.5 (GLOVE) ×2 IMPLANT
GOWN STRL REIN XL XLG (GOWN DISPOSABLE) ×4 IMPLANT
INCISOR TRUC PLUS BLADE 2.9 (ABLATOR) ×2
KIT HYSTEROSCOPY TRUCLEAR (ABLATOR) ×2 IMPLANT
MORCELLATOR RECIP TRUCLEAR 4.0 (ABLATOR) IMPLANT
NEEDLE SPNL 22GX3.5 QUINCKE BK (NEEDLE) ×2 IMPLANT
PACK VAGINAL MINOR WOMEN LF (CUSTOM PROCEDURE TRAY) ×2 IMPLANT
PAD OB MATERNITY 4.3X12.25 (PERSONAL CARE ITEMS) ×2 IMPLANT
SYR CONTROL 10ML LL (SYRINGE) ×2 IMPLANT
TOWEL OR 17X24 6PK STRL BLUE (TOWEL DISPOSABLE) ×4 IMPLANT
WATER STERILE IRR 1000ML POUR (IV SOLUTION) ×2 IMPLANT

## 2013-05-10 NOTE — Anesthesia Postprocedure Evaluation (Signed)
  Anesthesia Post-op Note  Patient: Theresa Castro  Procedure(s) Performed: Procedure(s): DILATATION & CURETTAGE/HYSTEROSCOPY WITH TRUECLEAR  (N/A)  Patient Location: PACU  Anesthesia Type:General  Level of Consciousness: awake, alert  and oriented  Airway and Oxygen Therapy: Patient Spontanous Breathing  Post-op Pain: none  Post-op Assessment: Post-op Vital signs reviewed, Patient's Cardiovascular Status Stable, Respiratory Function Stable, Patent Airway, No signs of Nausea or vomiting and Pain level controlled  Post-op Vital Signs: Reviewed and stable  Complications: No apparent anesthesia complications

## 2013-05-10 NOTE — Op Note (Signed)
Theresa Castro [birth year] [id number]   Post Operative Note   Date of surgery:  05/10/2013  Pre Op Dx:  Menorrhagia, endometrial polyp, leiomyoma  Post Op Dx:  Same  Procedure:  Hysteroscopy, D&C, Truclear endometrial polyp resection  Surgeon:  Colin Broach P  Anesthesia:  General  EBL:  Minimal  Distended media discrepancy:  Approximately 500 cc saline  Complications:  None  Specimen:  #1 Endometrial curetting #2 Endometrial polyp to pathology  Findings: EUA:  External BUS vagina normal. Cervix open with tissue extruding from os. Uterus anteverted bulky. Adnexa without masses   Operative:  Large endometrial polyp extruding from the cervix removed with ring forceps. Subsequent hysteroscopy was inadequate without clear visualization of the endometrial cavity due to clot and debris.  Procedure:  The patient was taken to the operating room, underwent general anesthesia, was placed in the low dorsal lithotomy position, received a perineal/vaginal preparation with Betadine solution by nursing personnel and the bladder was emptied with an in and out Foley catheterization. An EUA was performed. The timeout was performed by the surgical team. The patient was draped in the usual fashion. The cervix was visualized with a speculum, the anterior lip grasped with a single-tooth tenaculum and tissue was noted to be extruding from the cervical os. This was grasped with a small ring forcep and a large fleshy polyp was removed and sent to pathology. A paracervical block using 1% lidocaine was placed, a total of 10 cc. The uterus was then sounded but did not require dilatation. The Truclear small hysteroscope was then placed but poor visualization was noted due to blood clot and tissue debris. A sharp curettage was performed to empty the cavity and repeat hysteroscopy again was inadequate. Using the polyp blade several initial passes were made to remove polypoid tissue but at this point it became evident  that this would not be adequate to evacuate the endometrial cavity. The large Truclear hysteroscope was then placed within the cavity and attempts to irrigate and evacuate the clots and debris but there was still inadequate visualization. Another sharp curettage was performed but this did not adequately emptied the blood clots and debris. All the specimens were sent to pathology at this point it was felt unwise to proceed with the endometrial ablation portion of the procedure given the inadequate visualization.  Having removed the large polyp with subsequent D&C specimen an adequate sampling of the cavity was felt to have occurred. The instruments were removed and hemostasis was visualized at the tenaculum site and external os. The patient was then placed in the supine position, awakened without difficulty and taken to the recovery room in good condition having tolerated the procedure well.  Reported distended media discrepancy is an estimate noting a large amount of spillage on the floor.   Note: This document was prepared with digital dictation and possible smart phrase technology. Any transcriptional errors that result from this process are unintentional.  Dara Lords MD, 8:39 AM 05/10/2013

## 2013-05-10 NOTE — Anesthesia Preprocedure Evaluation (Addendum)
Anesthesia Evaluation  Patient identified by MRN, date of birth, ID band Patient awake    Reviewed: Allergy & Precautions, H&P , NPO status , Patient's Chart, lab work & pertinent test results, reviewed documented beta blocker date and time   History of Anesthesia Complications Negative for: history of anesthetic complications  Airway Mallampati: III TM Distance: >3 FB Neck ROM: full    Dental  (+) Teeth Intact   Pulmonary PE (postpartum in 1989, no blood clots since then - on lovenox starting today for periop period) breath sounds clear to auscultation  Pulmonary exam normal       Cardiovascular Exercise Tolerance: Good negative cardio ROS  Rhythm:regular Rate:Normal     Neuro/Psych  Headaches (migraines - monthly, last several weeks ago), negative psych ROS   GI/Hepatic negative GI ROS, Neg liver ROS,   Endo/Other  negative endocrine ROS  Renal/GU Renal disease (h/o kidney stones)  Female GU complaint     Musculoskeletal   Abdominal   Peds  Hematology  (+) anemia ,   Anesthesia Other Findings   Reproductive/Obstetrics negative OB ROS                          Anesthesia Physical Anesthesia Plan  ASA: II  Anesthesia Plan: General LMA   Post-op Pain Management:    Induction:   Airway Management Planned:   Additional Equipment:   Intra-op Plan:   Post-operative Plan:   Informed Consent: I have reviewed the patients History and Physical, chart, labs and discussed the procedure including the risks, benefits and alternatives for the proposed anesthesia with the patient or authorized representative who has indicated his/her understanding and acceptance.   Dental Advisory Given  Plan Discussed with: CRNA and Surgeon  Anesthesia Plan Comments:        Anesthesia Quick Evaluation

## 2013-05-10 NOTE — H&P (Signed)
  The patient was examined.  I reviewed the proposed surgery and consent form with the patient.  The dictated history and physical is current and accurate and all questions were answered. The patient is ready to proceed with surgery and has a realistic understanding and expectation for the outcome.   Dara Lords MD, 7:10 AM 05/10/2013

## 2013-05-10 NOTE — Transfer of Care (Signed)
Immediate Anesthesia Transfer of Care Note  Patient: Theresa Castro  Procedure(s) Performed: Procedure(s): DILATATION & CURETTAGE/HYSTEROSCOPY WITH TRUECLEAR  (N/A)  Patient Location: PACU  Anesthesia Type:General  Level of Consciousness: awake, alert  and oriented  Airway & Oxygen Therapy: Patient Spontanous Breathing and Patient connected to nasal cannula oxygen  Post-op Assessment: Report given to PACU RN and Post -op Vital signs reviewed and stable  Post vital signs: stable  Complications: No apparent anesthesia complications

## 2013-05-11 ENCOUNTER — Encounter (HOSPITAL_COMMUNITY): Payer: Self-pay | Admitting: Gynecology

## 2013-05-13 ENCOUNTER — Other Ambulatory Visit: Payer: Self-pay

## 2013-05-24 ENCOUNTER — Ambulatory Visit (INDEPENDENT_AMBULATORY_CARE_PROVIDER_SITE_OTHER): Payer: Managed Care, Other (non HMO) | Admitting: Gynecology

## 2013-05-24 ENCOUNTER — Encounter: Payer: Self-pay | Admitting: Gynecology

## 2013-05-24 DIAGNOSIS — Z9889 Other specified postprocedural states: Secondary | ICD-10-CM

## 2013-05-24 NOTE — Patient Instructions (Signed)
Keep track of your next several menstrual periods. Followup if they continue to be heavy and acceptable.

## 2013-05-24 NOTE — Progress Notes (Signed)
Patient presents postoperative from hysteroscopy D&C with removal of endometrial polyps. She was also scheduled for a ThermaChoice endometrial ablation but this was canceled due to an inadequate hysteroscopy. Findings at the time of surgery included a large endometrial polyp that was aborting spontaneously and multiple clots/polyps within the endometrial cavity. I was unable to satisfactorily evacuate the uterus for good visualization and I felt uncomfortable proceeding with endometrial ablation. All this was discussed with the patient and she understands this.  Exam with Berenice Bouton External BUS vagina normal. Cervix normal. Uterus normal size midline mobile nontender. Adnexa without masses or tenderness.  Assessment and plan: Menorrhagia with recent hysteroscopy D&C removal of endometrial polyps. Benign pathology. Patient will keep menstrual calendar over the next several cycles. If her menses continue heavy options to include HerOption endometrial ablation in the office, up to and including hysterectomy. Patient will keep menstrual calendar over the next several months and then we'll go from there. Her menses are acceptable then we'll follow and she'll represent for her annual exam September 2015.

## 2013-10-21 ENCOUNTER — Telehealth: Payer: Self-pay

## 2013-10-21 ENCOUNTER — Other Ambulatory Visit: Payer: Self-pay | Admitting: Gynecology

## 2013-10-21 DIAGNOSIS — N920 Excessive and frequent menstruation with regular cycle: Secondary | ICD-10-CM

## 2013-10-21 NOTE — Telephone Encounter (Signed)
Patient said back in Nov she had D&C and was supposed to have had endometrial ablation but you were unable to perform that.  She said you told her to see how her cycles were and follow up.  She said she is at least cycling regular now however, they are still very heavy.  She is interested in pursuing Her Option Ablation in the office if you think that is appropriate option for her.

## 2013-10-21 NOTE — Telephone Encounter (Signed)
Patient informed. Transferred to appt desk to schedule [facility].

## 2013-10-21 NOTE — Telephone Encounter (Signed)
Okay to consider that. I would recommend repeating the sonohysterogram given her history of polyps just to make sure she has not regrown any before considering the ablation.

## 2013-10-29 ENCOUNTER — Other Ambulatory Visit: Payer: Self-pay | Admitting: Gynecology

## 2013-10-29 DIAGNOSIS — D259 Leiomyoma of uterus, unspecified: Secondary | ICD-10-CM

## 2013-10-29 DIAGNOSIS — N920 Excessive and frequent menstruation with regular cycle: Secondary | ICD-10-CM

## 2013-11-05 ENCOUNTER — Ambulatory Visit (INDEPENDENT_AMBULATORY_CARE_PROVIDER_SITE_OTHER): Payer: Managed Care, Other (non HMO)

## 2013-11-05 ENCOUNTER — Ambulatory Visit (INDEPENDENT_AMBULATORY_CARE_PROVIDER_SITE_OTHER): Payer: Managed Care, Other (non HMO) | Admitting: Gynecology

## 2013-11-05 ENCOUNTER — Encounter: Payer: Self-pay | Admitting: Gynecology

## 2013-11-05 DIAGNOSIS — N920 Excessive and frequent menstruation with regular cycle: Secondary | ICD-10-CM

## 2013-11-05 DIAGNOSIS — D251 Intramural leiomyoma of uterus: Secondary | ICD-10-CM

## 2013-11-05 DIAGNOSIS — D259 Leiomyoma of uterus, unspecified: Secondary | ICD-10-CM

## 2013-11-05 NOTE — Progress Notes (Addendum)
Theresa Castro 1960-12-11 [id number]        52 y.o.  G3P3003 presents for sonohysterogram. History of menorrhagia and irregular bleeding. Had sonohysterogram 04/2013 which showed a probable anterior sessile polyp with biopsy showing proliferative endometrium. She was admitted for hysteroscopy D&C resection of the polyp and endometrial ablation at the same time and when she presented to the operating room she was found to have a large polyp extruding from the cervical os but to be aborting in response to the Cytotec used preoperatively. This was removed and a D&C performed. There was too much bleeding and never could clearly visualize the endometrial cavity to allow for endometrial ablation. Patient notes that her periods are now regular but heavy still and wants to reconsider ablation. I asked her to represent for a sonohysterogram to reassess the cavity.  Past medical history,surgical history, problem list, medications, allergies, family history and social history were all reviewed and documented in the EPIC chart.  Directed ROS with pertinent positives and negatives documented in the history of present illness/assessment and plan.  Exam: Sallee Lange assistant General appearance  Normal Pelvic external BUS vagina normal. Cervix normal. Uterus anteverted grossly normal in size midline mobile nontender. Adnexa without masses or tenderness.  Ultrasound shows uterus generous in size with several small myomas largest measuring 16 mm. Initial echo 15 mm. Question of endometrial polyp 13 x 12 in a more echogenic area. Ovaries are normal. Cul-de-sac negative.  Sonohysterogram performed after anterior lip stabilization with a single-tooth tenaculum to allow catheter placement. Good distention with thickened anterior endometrial wall questionable sessile polyp similar appearance as with her prior sonohysterogram 04/2013. Endometrial sample taken. Patient tolerated well.  Assessment/Plan:  53 y.o. [id number]  with continued menorrhagia. Sonohysterogram similar appearance as in October before she passed a large polyp. Question as to whether this is a postoperative change or regrowth of another polyp which would be unusual. Options for management include hysteroscopy D&C now to reevaluate the cavity with or without ablation. Consideration for manipulation such as Mirena IUD. 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 Hysterectomy for definitive treatment also reviewed. Patient will think of her options and we'll rediscuss after her biopsy results return.   Note: This document was prepared with digital dictation and possible smart phrase technology. Any transcriptional errors that result from this process are unintentional.   Anastasio Auerbach MD, 4:58 PM 11/05/2013

## 2013-11-05 NOTE — Patient Instructions (Signed)
Office will call you with biopsy results 

## 2014-05-09 ENCOUNTER — Encounter: Payer: Self-pay | Admitting: Gynecology

## 2014-07-11 ENCOUNTER — Ambulatory Visit (INDEPENDENT_AMBULATORY_CARE_PROVIDER_SITE_OTHER): Payer: Managed Care, Other (non HMO) | Admitting: Gynecology

## 2014-07-11 ENCOUNTER — Telehealth: Payer: Self-pay | Admitting: *Deleted

## 2014-07-11 ENCOUNTER — Encounter: Payer: Self-pay | Admitting: Gynecology

## 2014-07-11 VITALS — BP 120/64 | Ht 65.0 in | Wt 186.0 lb

## 2014-07-11 DIAGNOSIS — N926 Irregular menstruation, unspecified: Secondary | ICD-10-CM

## 2014-07-11 DIAGNOSIS — D251 Intramural leiomyoma of uterus: Secondary | ICD-10-CM

## 2014-07-11 DIAGNOSIS — Z01419 Encounter for gynecological examination (general) (routine) without abnormal findings: Secondary | ICD-10-CM

## 2014-07-11 LAB — HM MAMMOGRAPHY

## 2014-07-11 LAB — TSH: TSH: 2.087 u[IU]/mL (ref 0.350–4.500)

## 2014-07-11 NOTE — Telephone Encounter (Signed)
Referral faxed to [facility] they will contact pt to schedule.

## 2014-07-11 NOTE — Telephone Encounter (Signed)
-----   Message from Anastasio Auerbach, MD sent at 07/11/2014 11:08 AM EST ----- Set up an appointment with hematology, preferably Dr. Geanie Cooley reference family history of pulmonary embolus in grandmother mother and patient questionable need for coagulopathy workup

## 2014-07-11 NOTE — Progress Notes (Signed)
Theresa Castro 1960/07/24 [id number]        53 y.o.  G3P3003 for annual exam.  Several issues noted below.  Past medical history,surgical history, problem list, medications, allergies, family history and social history were all reviewed and documented as reviewed in the EPIC chart.  ROS:  Performed with pertinent positives and negatives included in the history, assessment and plan.   Additional significant findings :  none   Exam: Kim Counsellor Vitals:   07/11/14 1033  BP: 120/64  Height: 5\' 5"  (1.651 m)  Weight: 186 lb (84.369 kg)   General appearance:  Normal affect, orientation and appearance. Skin: Grossly normal HEENT: Without gross lesions.  No cervical or supraclavicular adenopathy. Thyroid normal.  Lungs:  Clear without wheezing, rales or rhonchi Cardiac: RR, without RMG Abdominal:  Soft, nontender, without masses, guarding, rebound, organomegaly or hernia Breasts:  Examined lying and sitting without masses, retractions, discharge or axillary adenopathy. Pelvic:  Ext/BUS/vagina normal  Cervix slight staining.  Uterus anteverted, normal size, shape and contour, midline and mobile nontender   Adnexa  Without masses or tenderness    Anus and perineum  Normal   Rectovaginal  Normal sphincter tone without palpated masses or tenderness.    Assessment/Plan:  54 y.o. G100P3003 female for annual exam, irregular menses, tubal sterilization.   1. Menses have become more irregular where she will skip a month or 2. When she does have menses she notes that they are light to the point of just staining for several days. No prolonged or atypical bleeding. Not having significant hot flashes or night sweats. Will check FSH/TSH. Keep menstrual calendar as long as not having prolonged or atypical bleeding will monitor. Follow up if significant hot flashes or night sweats and wants to discuss treatment options. 2. Pap smear/HPV negative 2013. No Pap smear done today. No history of  significant abnormal Pap smears previously.  Plan repeat at 3-5 your interval per current screening guidelines. 3. Mammography today. Continue with annual mammography. SBE monthly reviewed. 4. Colonoscopy never. Recommended screening colonoscopy and names and numbers provided.  Patient agrees to schedule. 5. History of pulmonary embolus in grandmother, mother and herself. Apparently mother was evaluated for coagulopathy and was negative. Given history recommend patient follow up with hematologist just make sure nothing further needs to be screened and tested for and to discuss prophylactic options. Patient currently is on a baby aspirin daily and I encouraged her to continue it for now. We'll help her arrange the appointment with the hematologist. 6. DEXA never. Will plan further into the menopause. 7. Health maintenance. No routine blood work done as patient reports this done at her primary physician's office. Follow up 1 year, sooner as needed.     Anastasio Auerbach MD, 11:38 AM 07/11/2014

## 2014-07-11 NOTE — Patient Instructions (Signed)
Office will call you in reference to the hematology appointment.  Schedule colonoscopy with [facility] at [phone] or [facility] gastroenterology at [phone]  You may obtain a copy of any labs that were done today by logging onto MyChart as outlined in the instructions provided with your AVS (after visit summary). The office will not call with normal lab results but certainly if there are any significant abnormalities then we will contact you.   Health Maintenance, Female A healthy lifestyle and preventative care can promote health and wellness.  Maintain regular health, dental, and eye exams.  Eat a healthy diet. Foods like vegetables, fruits, whole grains, low-fat dairy products, and lean protein foods contain the nutrients you need without too many calories. Decrease your intake of foods high in solid fats, added sugars, and salt. Get information about a proper diet from your caregiver, if necessary.  Regular physical exercise is one of the most important things you can do for your health. Most adults should get at least 150 minutes of moderate-intensity exercise (any activity that increases your heart rate and causes you to sweat) each week. In addition, most adults need muscle-strengthening exercises on 2 or more days a week.   Maintain a healthy weight. The body mass index (BMI) is a screening tool to identify possible weight problems. It provides an estimate of body fat based on height and weight. Your caregiver can help determine your BMI, and can help you achieve or maintain a healthy weight. For adults 20 years and older:  A BMI below 18.5 is considered underweight.  A BMI of 18.5 to 24.9 is normal.  A BMI of 25 to 29.9 is considered overweight.  A BMI of 30 and above is considered obese.  Maintain normal blood lipids and cholesterol by exercising and minimizing your intake of saturated fat. Eat a balanced diet with plenty of fruits and vegetables. Blood tests for  lipids and cholesterol should begin at age 67 and be repeated every 5 years. If your lipid or cholesterol levels are high, you are over 50, or you are a high risk for heart disease, you may need your cholesterol levels checked more frequently.Ongoing high lipid and cholesterol levels should be treated with medicines if diet and exercise are not effective.  If you smoke, find out from your caregiver how to quit. If you do not use tobacco, do not start.  Lung cancer screening is recommended for adults aged 46 80 years who are at high risk for developing lung cancer because of a history of smoking. Yearly low-dose computed tomography (CT) is recommended for people who have at least a 30-pack-year history of smoking and are a current smoker or have quit within the past 15 years. A pack year of smoking is smoking an average of 1 pack of cigarettes a day for 1 year (for example: 1 pack a day for 30 years or 2 packs a day for 15 years). Yearly screening should continue until the smoker has stopped smoking for at least 15 years. Yearly screening should also be stopped for people who develop a health problem that would prevent them from having lung cancer treatment.  If you are pregnant, do not drink alcohol. If you are breastfeeding, be very cautious about drinking alcohol. If you are not pregnant and choose to drink alcohol, do not exceed 1 drink per day. One drink is considered to be 12 ounces (355 mL) of beer, 5 ounces (148 mL) of wine, or 1.5 ounces (44 mL) of  liquor.  Avoid use of street drugs. Do not share needles with anyone. Ask for help if you need support or instructions about stopping the use of drugs.  High blood pressure causes heart disease and increases the risk of stroke. Blood pressure should be checked at least every 1 to 2 years. Ongoing high blood pressure should be treated with medicines, if weight loss and exercise are not effective.  If you are 11 to 53 years old, ask your caregiver if  you should take aspirin to prevent strokes.  Diabetes screening involves taking a blood sample to check your fasting blood sugar level. This should be done once every 3 years, after age 38, if you are within normal weight and without risk factors for diabetes. Testing should be considered at a younger age or be carried out more frequently if you are overweight and have at least 1 risk factor for diabetes.  Breast cancer screening is essential preventative care for women. You should practice "breast self-awareness." This means understanding the normal appearance and feel of your breasts and may include breast self-examination. Any changes detected, no matter how small, should be reported to a caregiver. Women in their 43s and 30s should have a clinical breast exam (CBE) by a caregiver as part of a regular health exam every 1 to 3 years. After age 17, women should have a CBE every year. Starting at age 71, women should consider having a mammogram (breast X-ray) every year. Women who have a family history of breast cancer should talk to their caregiver about genetic screening. Women at a high risk of breast cancer should talk to their caregiver about having an MRI and a mammogram every year.  Breast cancer gene (BRCA)-related cancer risk assessment is recommended for women who have family members with BRCA-related cancers. BRCA-related cancers include breast, ovarian, tubal, and peritoneal cancers. Having family members with these cancers may be associated with an increased risk for harmful changes (mutations) in the breast cancer genes BRCA1 and BRCA2. Results of the assessment will determine the need for genetic counseling and BRCA1 and BRCA2 testing.  The Pap test is a screening test for cervical cancer. Women should have a Pap test starting at age 39. Between ages 66 and 58, Pap tests should be repeated every 2 years. Beginning at age 46, you should have a Pap test every 3 years as long as the past 3 Pap  tests have been normal. If you had a hysterectomy for a problem that was not cancer or a condition that could lead to cancer, then you no longer need Pap tests. If you are between ages 34 and 79, and you have had normal Pap tests going back 10 years, you no longer need Pap tests. If you have had past treatment for cervical cancer or a condition that could lead to cancer, you need Pap tests and screening for cancer for at least 20 years after your treatment. If Pap tests have been discontinued, risk factors (such as a new sexual partner) need to be reassessed to determine if screening should be resumed. Some women have medical problems that increase the chance of getting cervical cancer. In these cases, your caregiver may recommend more frequent screening and Pap tests.  The human papillomavirus (HPV) test is an additional test that may be used for cervical cancer screening. The HPV test looks for the virus that can cause the cell changes on the cervix. The cells collected during the Pap test can be tested for  HPV. The HPV test could be used to screen women aged 70 years and older, and should be used in women of any age who have unclear Pap test results. After the age of 63, women should have HPV testing at the same frequency as a Pap test.  Colorectal cancer can be detected and often prevented. Most routine colorectal cancer screening begins at the age of 60 and continues through age 100. However, your caregiver may recommend screening at an earlier age if you have risk factors for colon cancer. On a yearly basis, your caregiver may provide home test kits to check for hidden blood in the stool. Use of a small camera at the end of a tube, to directly examine the colon (sigmoidoscopy or colonoscopy), can detect the earliest forms of colorectal cancer. Talk to your caregiver about this at age 48, when routine screening begins. Direct examination of the colon should be repeated every 5 to 10 years through age 36,  unless early forms of pre-cancerous polyps or small growths are found.  Hepatitis C blood testing is recommended for all people born from [birth year] through 1964 and any individual with known risks for hepatitis C.  Practice safe sex. Use condoms and avoid high-risk sexual practices to reduce the spread of sexually transmitted infections (STIs). Sexually active women aged 95 and younger should be checked for Chlamydia, which is a common sexually transmitted infection. Older women with new or multiple partners should also be tested for Chlamydia. Testing for other STIs is recommended if you are sexually active and at increased risk.  Osteoporosis is a disease in which the bones lose minerals and strength with aging. This can result in serious bone fractures. The risk of osteoporosis can be identified using a bone density scan. Women ages 65 and over and women at risk for fractures or osteoporosis should discuss screening with their caregivers. Ask your caregiver whether you should be taking a calcium supplement or vitamin D to reduce the rate of osteoporosis.  Menopause can be associated with physical symptoms and risks. Hormone replacement therapy is available to decrease symptoms and risks. You should talk to your caregiver about whether hormone replacement therapy is right for you.  Use sunscreen. Apply sunscreen liberally and repeatedly throughout the day. You should seek shade when your shadow is shorter than you. Protect yourself by wearing long sleeves, pants, a wide-brimmed hat, and sunglasses year round, whenever you are outdoors.  Notify your caregiver of new moles or changes in moles, especially if there is a change in shape or color. Also notify your caregiver if a mole is larger than the size of a pencil eraser.  Stay current with your immunizations. Document Released: 01/07/2011 Document Revised: 10/19/2012 Document Reviewed: 01/07/2011 [facility] Patient Information 2014 [place].

## 2014-07-12 LAB — FOLLICLE STIMULATING HORMONE: FSH: 23.1 m[IU]/mL

## 2015-02-04 ENCOUNTER — Encounter: Payer: Self-pay | Admitting: Cardiology

## 2015-10-26 ENCOUNTER — Ambulatory Visit (INDEPENDENT_AMBULATORY_CARE_PROVIDER_SITE_OTHER): Payer: Managed Care, Other (non HMO) | Admitting: Gynecology

## 2015-10-26 ENCOUNTER — Encounter: Payer: Self-pay | Admitting: Gynecology

## 2015-10-26 VITALS — BP 124/74 | Ht 64.0 in | Wt 191.0 lb

## 2015-10-26 DIAGNOSIS — D251 Intramural leiomyoma of uterus: Secondary | ICD-10-CM | POA: Diagnosis not present

## 2015-10-26 DIAGNOSIS — Z01419 Encounter for gynecological examination (general) (routine) without abnormal findings: Secondary | ICD-10-CM

## 2015-10-26 NOTE — Patient Instructions (Signed)
Schedule your colonoscopy with either:  [facility]   Address: [address], [place], [place] [address]  [phone]    or  [facility]  Address: [address], [place], [place] [address]  [phone]   Call to Schedule your mammogram  [facility]. [address] Phone: [phone]     Mammogram A mammogram is an X-ray test to find changes in a woman's breast. You should get a mammogram if:  You are 27 years of age or older  You have risk factors.   Your doctor recommends that you have one.  BEFORE THE TEST  Do not schedule the test the week before your period, especially if your breasts are sore during this time.  On the day of your mammogram:  Wash your breasts and armpits well. After washing, do not put on any deodorant or talcum powder on until after your test.   Eat and drink as you usually do.   Take your medicines as usual.   If you are diabetic and take insulin, make sure you:   Eat before coming for your test.   Take your insulin as usual.   If you cannot keep your appointment, call before the appointment to cancel. Schedule another appointment.  TEST  You will need to undress from the waist up. You will put on a hospital gown.   Your breast will be put on the mammogram machine, and it will press firmly on your breast with a piece of plastic called a compression paddle. This will make your breast flatter so that the machine can X-ray all parts of your breast.   Both breasts will be X-rayed. Each breast will be X-rayed from above and from the side. An X-ray might need to be taken again if the picture is not good enough.   The mammogram will last about 15 to 30 minutes.  AFTER THE TEST Finding out the results of your test Ask when your test results will be ready. Make sure you get your test results.  Document Released: 09/20/2008 Document Revised:  06/13/2011 Document Reviewed: 09/20/2008 [facility] Patient Information 2012 [place].     You may obtain a copy of any labs that were done today by logging onto MyChart as outlined in the instructions provided with your AVS (after visit summary). The office will not call with normal lab results but certainly if there are any significant abnormalities then we will contact you.   Health Maintenance Adopting a healthy lifestyle and getting preventive care can go a long way to promote health and wellness. Talk with your health care provider about what schedule of regular examinations is right for you. This is a good chance for you to check in with your provider about disease prevention and staying healthy. In between checkups, there are plenty of things you can do on your own. Experts have done a lot of research about which lifestyle changes and preventive measures are most likely to keep you healthy. Ask your health care provider for more information. WEIGHT AND DIET  Eat a healthy diet  Be sure to include plenty of vegetables, fruits, low-fat dairy products, and lean protein.  Do not eat a lot of foods high in solid fats, added sugars, or salt.  Get regular exercise. This is one of the most important things you can do for your health. 1. Most adults should exercise for at least 150 minutes each  week. The exercise should increase your heart rate and make you sweat (moderate-intensity exercise). 2. Most adults should also do strengthening exercises at least twice a week. This is in addition to the moderate-intensity exercise.  Maintain a healthy weight  Body mass index (BMI) is a measurement that can be used to identify possible weight problems. It estimates body fat based on height and weight. Your health care provider can help determine your BMI and help you achieve or maintain a healthy weight.  For females 52 years of age and older:  1. A BMI below 18.5 is considered  underweight. 2. A BMI of 18.5 to 24.9 is normal. 3. A BMI of 25 to 29.9 is considered overweight. 4. A BMI of 30 and above is considered obese.  Watch levels of cholesterol and blood lipids  You should start having your blood tested for lipids and cholesterol at 54 years of age, then have this test every 5 years.  You may need to have your cholesterol levels checked more often if: 1. Your lipid or cholesterol levels are high. 2. You are older than 54 years of age. 3. You are at high risk for heart disease.  CANCER SCREENING   Lung Cancer  Lung cancer screening is recommended for adults 74-70 years old who are at high risk for lung cancer because of a history of smoking.  A yearly low-dose CT scan of the lungs is recommended for people who: 1. Currently smoke. 2. Have quit within the past 15 years. 3. Have at least a 30-pack-year history of smoking. A pack year is smoking an average of one pack of cigarettes a day for 1 year.  Yearly screening should continue until it has been 15 years since you quit.  Yearly screening should stop if you develop a health problem that would prevent you from having lung cancer treatment.  Breast Cancer  Practice breast self-awareness. This means understanding how your breasts normally appear and feel.  It also means doing regular breast self-exams. Let your health care provider know about any changes, no matter how small.  If you are in your 20s or 30s, you should have a clinical breast exam (CBE) by a health care provider every 1-3 years as part of a regular health exam.  If you are 30 or older, have a CBE every year. Also consider having a breast X-ray (mammogram) every year.  If you have a family history of breast cancer, talk to your health care provider about genetic screening.  If you are at high risk for breast cancer, talk to your health care provider about having an MRI and a mammogram every year.  Breast cancer gene (BRCA)  assessment is recommended for women who have family members with BRCA-related cancers. BRCA-related cancers include:  Breast.  Ovarian.  Tubal.  Peritoneal cancers.  Results of the assessment will determine the need for genetic counseling and BRCA1 and BRCA2 testing. Cervical Cancer Routine pelvic examinations to screen for cervical cancer are no longer recommended for nonpregnant women who are considered low risk for cancer of the pelvic organs (ovaries, uterus, and vagina) and who do not have symptoms. A pelvic examination may be necessary if you have symptoms including those associated with pelvic infections. Ask your health care provider if a screening pelvic exam is right for you.   The Pap test is the screening test for cervical cancer for women who are considered at risk.  If you had a hysterectomy for a problem that was  not cancer or a condition that could lead to cancer, then you no longer need Pap tests.  If you are older than 65 years, and you have had normal Pap tests for the past 10 years, you no longer need to have Pap tests.  If you have had past treatment for cervical cancer or a condition that could lead to cancer, you need Pap tests and screening for cancer for at least 20 years after your treatment.  If you no longer get a Pap test, assess your risk factors if they change (such as having a new sexual partner). This can affect whether you should start being screened again.  Some women have medical problems that increase their chance of getting cervical cancer. If this is the case for you, your health care provider may recommend more frequent screening and Pap tests.  The human papillomavirus (HPV) test is another test that may be used for cervical cancer screening. The HPV test looks for the virus that can cause cell changes in the cervix. The cells collected during the Pap test can be tested for HPV.  The HPV test can be used to screen women 23 years of age and older.  Getting tested for HPV can extend the interval between normal Pap tests from three to five years.  An HPV test also should be used to screen women of any age who have unclear Pap test results.  After 54 years of age, women should have HPV testing as often as Pap tests.  Colorectal Cancer  This type of cancer can be detected and often prevented.  Routine colorectal cancer screening usually begins at 54 years of age and continues through 54 years of age.  Your health care provider may recommend screening at an earlier age if you have risk factors for colon cancer.  Your health care provider may also recommend using home test kits to check for hidden blood in the stool.  A small camera at the end of a tube can be used to examine your colon directly (sigmoidoscopy or colonoscopy). This is done to check for the earliest forms of colorectal cancer.  Routine screening usually begins at age 53.  Direct examination of the colon should be repeated every 5-10 years through 54 years of age. However, you may need to be screened more often if early forms of precancerous polyps or small growths are found. Skin Cancer  Check your skin from head to toe regularly.  Tell your health care provider about any new moles or changes in moles, especially if there is a change in a mole's shape or color.  Also tell your health care provider if you have a mole that is larger than the size of a pencil eraser.  Always use sunscreen. Apply sunscreen liberally and repeatedly throughout the day.  Protect yourself by wearing long sleeves, pants, a wide-brimmed hat, and sunglasses whenever you are outside. HEART DISEASE, DIABETES, AND HIGH BLOOD PRESSURE   Have your blood pressure checked at least every 1-2 years. High blood pressure causes heart disease and increases the risk of stroke.  If you are between 49 years and 45 years old, ask your health care provider if you should take aspirin to prevent  strokes.  Have regular diabetes screenings. This involves taking a blood sample to check your fasting blood sugar level.  If you are at a normal weight and have a low risk for diabetes, have this test once every three years after 54 years of age.  and have a high risk for diabetes, consider being tested at a younger age or more often. PREVENTING INFECTION  Hepatitis B  If you have a higher risk for hepatitis B, you should be screened for this virus. You are considered at high risk for hepatitis B if:  You were born in a country where hepatitis B is common. Ask your health care provider which countries are considered high risk.  Your parents were born in a high-risk country, and you have not been immunized against hepatitis B (hepatitis B vaccine).  You have HIV or AIDS.  You use needles to inject street drugs.  You live with someone who has hepatitis B.  You have had sex with someone who has hepatitis B.  You get hemodialysis treatment.  You take certain medicines for conditions, including cancer, organ transplantation, and autoimmune conditions. Hepatitis C  Blood testing is recommended for:  Everyone born from 1945 through 1965.  Anyone with known risk factors for hepatitis C. Sexually transmitted infections (STIs)  You should be screened for sexually transmitted infections (STIs) including gonorrhea and chlamydia if:  You are sexually active and are younger than 54 years of age.  You are older than 54 years of age and your health care provider tells you that you are at risk for this type of infection.  Your sexual activity has changed since you were last screened and you are at an increased risk for chlamydia or gonorrhea. Ask your health care provider if you are at risk.  If you do not have HIV, but are at risk, it may be recommended that you take a prescription medicine daily to prevent HIV infection. This is called pre-exposure prophylaxis  (PrEP). You are considered at risk if:  You are sexually active and do not regularly use condoms or know the HIV status of your partner(s).  You take drugs by injection.  You are sexually active with a partner who has HIV. Talk with your health care provider about whether you are at high risk of being infected with HIV. If you choose to begin PrEP, you should first be tested for HIV. You should then be tested every 3 months for as long as you are taking PrEP.  PREGNANCY   If you are premenopausal and you may become pregnant, ask your health care provider about preconception counseling.  If you may become pregnant, take 400 to 800 micrograms (mcg) of folic acid every day.  If you want to prevent pregnancy, talk to your health care provider about birth control (contraception). OSTEOPOROSIS AND MENOPAUSE   Osteoporosis is a disease in which the bones lose minerals and strength with aging. This can result in serious bone fractures. Your risk for osteoporosis can be identified using a bone density scan.  If you are 65 years of age or older, or if you are at risk for osteoporosis and fractures, ask your health care provider if you should be screened.  Ask your health care provider whether you should take a calcium or vitamin D supplement to lower your risk for osteoporosis.  Menopause may have certain physical symptoms and risks.  Hormone replacement therapy may reduce some of these symptoms and risks. Talk to your health care provider about whether hormone replacement therapy is right for you.  HOME CARE INSTRUCTIONS   Schedule regular health, dental, and eye exams.  Stay current with your immunizations.   Do not use any tobacco products including cigarettes, chewing tobacco, or electronic cigarettes.  If you   cigarettes.  If you are pregnant, do not drink alcohol.  If you are breastfeeding, limit how much and how often you drink alcohol.  Limit alcohol intake to no more than 1 drink per day for  nonpregnant women. One drink equals 12 ounces of beer, 5 ounces of wine, or 1 ounces of hard liquor.  Do not use street drugs.  Do not share needles.  Ask your health care provider for help if you need support or information about quitting drugs.  Tell your health care provider if you often feel depressed.  Tell your health care provider if you have ever been abused or do not feel safe at home. Document Released: 01/07/2011 Document Revised: 11/08/2013 Document Reviewed: 05/26/2013 [facility] Patient Information 2015 [place], [place]. This information is not intended to replace advice given to you by your health care provider. Make sure you discuss any questions you have with your health care provider.

## 2015-10-26 NOTE — Progress Notes (Signed)
    Theresa Castro 10/15/1960 [id number]        55 y.o.  G3P3003  for annual exam.  Doing well without complaints.  Past medical history,surgical history, problem list, medications, allergies, family history and social history were all reviewed and documented as reviewed in the EPIC chart.  ROS:  Performed with pertinent positives and negatives included in the history, assessment and plan.   Additional significant findings :  none   Exam: Caryn Bee assistant Filed Vitals:   10/26/15 0948  BP: 124/74  Height: 5\' 4"  (1.626 m)  Weight: 191 lb (86.637 kg)   General appearance:  Normal affect, orientation and appearance. Skin: Grossly normal HEENT: Without gross lesions.  No cervical or supraclavicular adenopathy. Thyroid normal.  Lungs:  Clear without wheezing, rales or rhonchi Cardiac: RR, without RMG Abdominal:  Soft, nontender, without masses, guarding, rebound, organomegaly or hernia Breasts:  Examined lying and sitting without masses, retractions, discharge or axillary adenopathy. Pelvic:  Ext/BUS/vagina light menses flow  Cervix normal  Uterus bulky consistent with history of leiomyoma. Midline mobile nontender  Adnexa without masses or tenderness    Anus and perineum normal   Rectovaginal normal sphincter tone without palpated masses or tenderness.    Assessment/Plan:  55 y.o. G14P3003 female for annual exam with regular menses, tubal sterilization.   1. Continues with regular menses. No significant hot flashes or night sweats. Continue to monitor this year. Call if significant irregular bleeding or significant menopausal symptoms. 2. History of leiomyoma. Uterus overall stable on exam. Continue to monitor with annual exams. 3. Mammography due now patient knows to call and schedule. SBE monthly reviewed. 4. Pap smear/HPV 12/2011 negative. No Pap smear done today. No history of abnormal Pap smears. Plan repeat next year at five-year interval per current screening  guidelines 5. Colonoscopy never. I recommended patient schedule a screening colonoscopy as she is over the age of 7. Names and numbers provided. 6. Health maintenance. No routine lab work done as patient reports is done elsewhere. Follow up 1 year, sooner as needed.   Anastasio Auerbach MD, 10:20 AM 10/26/2015

## 2017-11-04 ENCOUNTER — Ambulatory Visit (INDEPENDENT_AMBULATORY_CARE_PROVIDER_SITE_OTHER): Payer: Managed Care, Other (non HMO) | Admitting: Gynecology

## 2017-11-04 ENCOUNTER — Encounter: Payer: Self-pay | Admitting: Gynecology

## 2017-11-04 VITALS — BP 124/74 | Ht 63.5 in | Wt 189.0 lb

## 2017-11-04 DIAGNOSIS — Z1151 Encounter for screening for human papillomavirus (HPV): Secondary | ICD-10-CM

## 2017-11-04 DIAGNOSIS — D259 Leiomyoma of uterus, unspecified: Secondary | ICD-10-CM | POA: Diagnosis not present

## 2017-11-04 DIAGNOSIS — Z01411 Encounter for gynecological examination (general) (routine) with abnormal findings: Secondary | ICD-10-CM

## 2017-11-04 NOTE — Addendum Note (Signed)
Addended by: Nelva Nay on: 11/04/2017 03:56 PM   Modules accepted: Orders

## 2017-11-04 NOTE — Patient Instructions (Addendum)
Follow-up in 1 year for annual exam, sooner if any issues. 

## 2017-11-04 NOTE — Progress Notes (Signed)
    Theresa Castro Feb 17, 1961 [id number]        57 y.o.  G3P3003 for annual gynecologic exam.  Has not been in the office for 2 years.  Last menses/bleeding episode 2017.  Is having some hot flushes and sweats.  Past medical history,surgical history, problem list, medications, allergies, family history and social history were all reviewed and documented as reviewed in the EPIC chart.  ROS:  Performed with pertinent positives and negatives included in the history, assessment and plan.   Additional significant findings : None   Exam: Caryn Bee assistant Vitals:   11/04/17 1452  BP: 124/74  Weight: 189 lb (85.7 kg)  Height: 5' 3.5" (1.613 m)   Body mass index is 32.95 kg/m.  General appearance:  Normal affect, orientation and appearance. Skin: Grossly normal HEENT: Without gross lesions.  No cervical or supraclavicular adenopathy. Thyroid normal.  Lungs:  Clear without wheezing, rales or rhonchi Cardiac: RR, without RMG Abdominal:  Soft, nontender, without masses, guarding, rebound, organomegaly or hernia Breasts:  Examined lying and sitting without masses, retractions, discharge or axillary adenopathy. Pelvic:  Ext, BUS, Vagina: Normal  Cervix: Normal  Uterus: Mildly enlarged, midline and mobile nontender   Adnexa: Without masses or tenderness    Anus and perineum: Normal   Rectovaginal: Normal sphincter tone without palpated masses or tenderness.    Assessment/Plan:  57 y.o. G52P3003 female for annual gynecologic exam.   1. Postmenopausal/menopausal symptoms.  Last bleeding episode 2 years ago.  Is having some hot flushes and sweats.  Most recently diagnosed with left DVT currently on Eliquis.  We discussed management of her menopausal symptoms.  Given the recent DVT I think HRT at this point is contraindicated.  She is on Wellbutrin for depression with a recent dose adjustment.  Do not feel adding Effexor appropriate in this situation.  Reviewed that may be the increased  dose adjustment with the Wellbutrin may help with her symptoms.  Patient is going to monitor her symptoms for now we will follow-up with a significantly worsening she wants to rediscuss treatment options. 2. Leiomyoma.  History of leiomyoma with enlarged uterus in the past.  Uterus mildly enlarged today on exam consistent with her history although feels smaller than before consistent with her postmenopausal status.  She is asymptomatic without pressure/pain or bleeding issues.  Continue to monitor with annual exams. 3. Mammography 04/2017.  Continue with annual mammography when due.  Breast exam normal today. 4. Pap smear/HPV 2013.  Pap smear/HPV done today.  No history of abnormal Pap smears previously. 5. Colonoscopy never.  Did Cologuard through her primary physician's office.  She will continue to follow-up with them in reference to colon screening. 6. DEXA never.  Will plan further into the menopause. 7. Health maintenance.  No routine lab work done as patient does this elsewhere.  Follow-up 1 year, sooner as needed.   Anastasio Auerbach MD, 3:37 PM 11/04/2017

## 2017-11-05 LAB — PAP IG AND HPV HIGH-RISK: HPV DNA HIGH RISK: NOT DETECTED

## 2017-12-26 DIAGNOSIS — I82412 Acute embolism and thrombosis of left femoral vein: Secondary | ICD-10-CM

## 2017-12-26 DIAGNOSIS — Z86718 Personal history of other venous thrombosis and embolism: Secondary | ICD-10-CM | POA: Diagnosis not present

## 2017-12-26 DIAGNOSIS — Z86711 Personal history of pulmonary embolism: Secondary | ICD-10-CM

## 2017-12-26 DIAGNOSIS — Z7901 Long term (current) use of anticoagulants: Secondary | ICD-10-CM | POA: Diagnosis not present

## 2017-12-26 DIAGNOSIS — Z808 Family history of malignant neoplasm of other organs or systems: Secondary | ICD-10-CM

## 2017-12-26 DIAGNOSIS — Z832 Family history of diseases of the blood and blood-forming organs and certain disorders involving the immune mechanism: Secondary | ICD-10-CM

## 2018-01-16 DIAGNOSIS — Z832 Family history of diseases of the blood and blood-forming organs and certain disorders involving the immune mechanism: Secondary | ICD-10-CM | POA: Diagnosis not present

## 2018-01-16 DIAGNOSIS — Z7901 Long term (current) use of anticoagulants: Secondary | ICD-10-CM | POA: Diagnosis not present

## 2018-01-16 DIAGNOSIS — I82412 Acute embolism and thrombosis of left femoral vein: Secondary | ICD-10-CM | POA: Diagnosis not present

## 2019-04-06 ENCOUNTER — Encounter: Payer: Self-pay | Admitting: Gynecology

## 2020-12-21 ENCOUNTER — Telehealth: Payer: Self-pay | Admitting: Oncology

## 2020-12-21 NOTE — Telephone Encounter (Signed)
Patient will be see Dr Robyn Haber on 6/24

## 2020-12-21 NOTE — Telephone Encounter (Signed)
Patient referred by Dr Orrin Brigham for Breast CA.  Appt made for 12/29/20 10:30 am - Consult 11:00 am  Patient is also scheduled w/Dr Orlene Erm same day: Nurse 9:00 am - Dr Orlene Erm 9:30 am

## 2020-12-28 NOTE — Progress Notes (Signed)
[facility]  [address] [place],  [place]  [address] [phone]  Clinic Day:  01/03/2021  Referring physician: Orrin Brigham, MD   HISTORY OF PRESENT ILLNESS:  The patient is a 60 y.o. female who I was asked to consult upon for newly diagnosed breast cancer.  Her history dates back to March 2022 when her annual screening mammogram showed a mass in her right breast, which was further confirmed with a diagnostic mammogram and ultrasound in April 2022.  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  This led to her undergoing a lumpectomy in early June 2022.  This time, pathology from her lumpectomy revealed an 46m focus of  invasive ductal carcinoma that was both estrogen and progesterone receptor negative, but Her2/neu receptor positive.  4 lymph nodes were removed, for which none contained disease.  She comes in today to go over her breast cancer pathology and its implications.  The patient denies ever noticing any changes in her breasts which ever alerted her to having breast cancer.  There is no family history of either breast or ovarian cancer.  She denies having any other changes in her health over these past months.    PAST MEDICAL HISTORY:   Past Medical History:  Diagnosis Date   Anemia    Anxiety    Arthritis    wrists - tx with otc med prn   Breast cancer ([facility]    Depression    DVT (deep venous thrombosis) ([facility])    Kidney stones    no surgery required - passed stones   Leiomyoma    Migraines    [place]ulmonary embolism ([facility] 1989   after c/s POST PARTUM   Seasonal allergies     PAST SURGICAL HISTORY:   Past Surgical History:  Procedure Laterality Date   CESAREAN SECTION     X3   DILATATION & CURETTAGE/HYSTEROSCOPY WITH TRUECLEAR N/A 05/10/2013   Procedure: DILATATION & CURETTAGE/HYSTEROSCOPY WITH TRUECLEAR ;  Surgeon: TAnastasio Auerbach MD;  [facility];  Service: Gynecology;  Laterality: N/A;   TUBAL LIGATION     WISDOM TOOTH EXTRACTION      CURRENT MEDICATIONS:   Current Outpatient Medications  Medication Sig Dispense Refill   apixaban (ELIQUIS) 5 MG TABS tablet Take 5 mg by mouth 2 (two) times daily.     buPROPion (WELLBUTRIN XL) 300 MG 24 hr tablet Take 300 mg by mouth daily.     fluticasone (FLONASE) 50 MCG/ACT nasal spray Place 2 sprays into the nose daily.     SUMAtriptan (IMITREX) 50 MG tablet Take 50 mg by mouth as needed.     UNABLE TO FIND B 12 injection     No current facility-administered medications for this visit.    ALLERGIES:  No Known Allergies  FAMILY HISTORY:   Family History  Problem Relation Age of Onset   Stroke Mother    Deep vein thrombosis Mother    Pulmonary embolism Mother    Cancer Maternal Grandfather        Throat cancer    SOCIAL HISTORY:  [place]  [place]  They have 3 children and 2 grandchildren. 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  There is no history of alcohol or tobacco use.    REVIEW  OF SYSTEMS:  Review of Systems  Constitutional:  Negative for fatigue and fever.       Occasional hot flashes  HENT:   Negative for hearing loss and sore throat.   Eyes:  Negative for eye problems.  Respiratory:  Negative for chest tightness, cough and hemoptysis.   Cardiovascular:  Negative for chest pain and palpitations.  Gastrointestinal:  Negative for abdominal distention, abdominal pain, blood in stool, constipation, diarrhea, nausea and vomiting.  Endocrine: Negative for hot flashes.  Genitourinary:  Negative for difficulty urinating, dysuria, frequency, hematuria and nocturia.   Musculoskeletal:  Negative for arthralgias, back pain, gait problem and myalgias.  Skin: Negative.  Negative for itching and rash.  Neurological: Negative.  Negative for dizziness,  extremity weakness, gait problem, headaches, light-headedness and numbness.  Hematological: Negative.   Psychiatric/Behavioral: Negative.  Negative for depression and suicidal ideas. The patient is not nervous/anxious.     PHYSICAL EXAM:  Blood pressure 133/62, pulse 75, temperature 98.4 F (36.9 C), resp. rate 16, height 5' 4"  (1.626 m), weight 191 lb 6.4 oz (86.8 kg), last menstrual period 10/25/2015, SpO2 95 %. Wt Readings from Last 3 Encounters:  12/29/20 191 lb 6.4 oz (86.8 kg)  11/04/17 189 lb (85.7 kg)  10/26/15 191 lb (86.6 kg)   Body mass index is 32.85 kg/m. Performance status (ECOG): 0 - Asymptomatic Physical Exam Constitutional:      Appearance: Normal appearance.  HENT:     Mouth/Throat:     Pharynx: Oropharynx is clear. No oropharyngeal exudate.  Cardiovascular:     Rate and Rhythm: Normal rate and regular rhythm.     Heart sounds: No murmur heard.   No friction rub. No gallop.  Pulmonary:     Breath sounds: Normal breath sounds.  Chest:  Breasts:    Right: No swelling, bleeding, inverted nipple, mass, nipple discharge, skin change, axillary adenopathy or supraclavicular adenopathy.     Left: No swelling, bleeding, inverted nipple, mass, nipple discharge, skin change, axillary adenopathy or supraclavicular adenopathy.     Comments: Right lumpectomy site is healing well from recent surgery  Abdominal:     General: Bowel sounds are normal. There is no distension.     Palpations: Abdomen is soft. There is no mass.     Tenderness: There is no abdominal tenderness.  Musculoskeletal:        General: No tenderness.     Cervical back: Normal range of motion and neck supple.     Right lower leg: No edema.     Left lower leg: No edema.  Lymphadenopathy:     Cervical: No cervical adenopathy.     Right cervical: No superficial, deep or posterior cervical adenopathy.    Left cervical: No superficial, deep or posterior cervical adenopathy.     Upper Body:     Right  upper body: No supraclavicular or axillary adenopathy.     Left upper body: No supraclavicular or axillary adenopathy.     Lower Body: No right inguinal adenopathy. No left inguinal adenopathy.  Skin:    Coloration: Skin is not jaundiced.     Findings: No lesion or rash.  Neurological:     General: No focal deficit present.     Mental Status: She is alert and oriented to person, place, and time. Mental status is at baseline.  Psychiatric:        Mood and Affect: Mood normal.        Behavior: Behavior normal.  Thought Content: Thought content normal.        Judgment: Judgment normal.    ASSESSMENT & PLAN:  A 60 y.o. 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  Although her tumor is small, she understands that its Her2 positivity is significant and warrants adjuvant therapy to be given.  In clinic today, we had a robust discussion about adjuvant taxane/Her2 therapy.  Understandably, the patient is not enamored with undergoing weekly paclitaxel, particularly as it causes peripheral neuropathy and alopecia.  She has no objection to considering adjuvant Her2 therapy for her disease.  However, I do not believe single-agent Herceptin is not potent enough to present future disease recurrence.  We came to the mutual decision to give her adjuvant Perjeta/Herceptin, which will be given once every 3 weeks for a full year.  I will also have her undergo adjuvant breast radiation to help prevent local disease recurrence.  She will have a port placed through which all of her Her2 therapy will be given.  I will also have her undergo an echocardiogram to ensure she had no depressed ejection fraction before commencing with treatment.  Her dual Her2 therapy will commence within the next 1-2 weeks.  I will see her back 3 weeks later before she heads into her 2nd of 18 planned cycles of adjuvant dual Her2 therapy.  The patient understands all the plans discussed  today and is in agreement with them.  I do appreciate Dr Orrin Brigham for his new consult.   Olivia Pavelko Macarthur Critchley, MD

## 2020-12-29 ENCOUNTER — Encounter: Payer: Self-pay | Admitting: Oncology

## 2020-12-29 ENCOUNTER — Inpatient Hospital Stay: Payer: Managed Care, Other (non HMO) | Attending: Oncology | Admitting: Oncology

## 2020-12-29 ENCOUNTER — Other Ambulatory Visit: Payer: Self-pay | Admitting: Oncology

## 2020-12-29 ENCOUNTER — Inpatient Hospital Stay: Payer: Managed Care, Other (non HMO)

## 2020-12-29 ENCOUNTER — Other Ambulatory Visit: Payer: Self-pay

## 2020-12-29 DIAGNOSIS — Z171 Estrogen receptor negative status [ER-]: Secondary | ICD-10-CM | POA: Diagnosis not present

## 2020-12-29 DIAGNOSIS — C50419 Malignant neoplasm of upper-outer quadrant of unspecified female breast: Secondary | ICD-10-CM

## 2020-12-29 DIAGNOSIS — Z86711 Personal history of pulmonary embolism: Secondary | ICD-10-CM | POA: Insufficient documentation

## 2020-12-29 DIAGNOSIS — Z86718 Personal history of other venous thrombosis and embolism: Secondary | ICD-10-CM | POA: Insufficient documentation

## 2020-12-29 DIAGNOSIS — C50412 Malignant neoplasm of upper-outer quadrant of left female breast: Secondary | ICD-10-CM

## 2020-12-29 DIAGNOSIS — Z79899 Other long term (current) drug therapy: Secondary | ICD-10-CM | POA: Insufficient documentation

## 2020-12-29 DIAGNOSIS — Z7901 Long term (current) use of anticoagulants: Secondary | ICD-10-CM | POA: Insufficient documentation

## 2020-12-29 DIAGNOSIS — C50919 Malignant neoplasm of unspecified site of unspecified female breast: Secondary | ICD-10-CM | POA: Insufficient documentation

## 2020-12-29 DIAGNOSIS — Z170 Estrogen receptor positive status [ER+]: Secondary | ICD-10-CM | POA: Insufficient documentation

## 2020-12-29 NOTE — Progress Notes (Signed)
START OFF PATHWAY REGIMEN - Breast   [id number]:Pertuzumab 840/420 mg IV D1 + Trastuzumab 8/6 mg/kg IV D1 q21 Days:   A cycle is every 21 days:     Pertuzumab      Pertuzumab      Trastuzumab-xxxx      Trastuzumab-xxxx   **Always confirm dose/schedule in your pharmacy ordering system**  Patient Characteristics: Postoperative without Neoadjuvant Therapy (Pathologic Staging), Invasive Disease, Adjuvant Therapy, HER2 Positive, ER Negative/Unknown, Node Negative, pT1b, pN0/N19m, Chemotherapy Indicated Therapeutic Status: Postoperative without Neoadjuvant Therapy (Pathologic Staging) AJCC Grade: G2 AJCC N Category: pN0 AJCC M Category: cM0 ER Status: Negative (-) AJCC 8 Stage Grouping: IA HER2 Status: Positive (+) Oncotype Dx Recurrence Score: Not Appropriate AJCC T Category: pT1b PR Status: Negative (-) Adjuvant Therapy Status: No Adjuvant Therapy Received Yet or Changing Initial Adjuvant Regimen due to Tolerance Intent of Therapy: Curative Intent, Discussed with Patient

## 2021-01-02 ENCOUNTER — Encounter: Payer: Self-pay | Admitting: Oncology

## 2021-01-02 NOTE — Progress Notes (Signed)
..  The following biosimilar Kanjinti (trastuzumab-anns) has been selected for use in this patient due to insurance preference.

## 2021-01-03 ENCOUNTER — Encounter: Payer: Self-pay | Admitting: Oncology

## 2021-01-05 ENCOUNTER — Telehealth: Payer: Self-pay | Admitting: Oncology

## 2021-01-05 DIAGNOSIS — T451X1A Poisoning by antineoplastic and immunosuppressive drugs, accidental (unintentional), initial encounter: Secondary | ICD-10-CM

## 2021-01-05 DIAGNOSIS — C50419 Malignant neoplasm of upper-outer quadrant of unspecified female breast: Secondary | ICD-10-CM

## 2021-01-05 NOTE — Telephone Encounter (Signed)
Per 6/24 Staff Msg, patient scheduled for 7/5 Labs, Chemo Education - 7/11 Infusion 1st Treatment

## 2021-01-09 ENCOUNTER — Other Ambulatory Visit: Payer: Self-pay | Admitting: Hematology and Oncology

## 2021-01-09 ENCOUNTER — Other Ambulatory Visit: Payer: Self-pay

## 2021-01-09 ENCOUNTER — Inpatient Hospital Stay: Payer: Managed Care, Other (non HMO)

## 2021-01-09 ENCOUNTER — Inpatient Hospital Stay: Payer: Managed Care, Other (non HMO) | Attending: Oncology | Admitting: Hematology and Oncology

## 2021-01-09 DIAGNOSIS — Z7901 Long term (current) use of anticoagulants: Secondary | ICD-10-CM | POA: Diagnosis not present

## 2021-01-09 DIAGNOSIS — C50412 Malignant neoplasm of upper-outer quadrant of left female breast: Secondary | ICD-10-CM

## 2021-01-09 DIAGNOSIS — Z86718 Personal history of other venous thrombosis and embolism: Secondary | ICD-10-CM | POA: Diagnosis not present

## 2021-01-09 DIAGNOSIS — Z86711 Personal history of pulmonary embolism: Secondary | ICD-10-CM | POA: Diagnosis not present

## 2021-01-09 DIAGNOSIS — Z5112 Encounter for antineoplastic immunotherapy: Secondary | ICD-10-CM | POA: Insufficient documentation

## 2021-01-09 DIAGNOSIS — Z171 Estrogen receptor negative status [ER-]: Secondary | ICD-10-CM

## 2021-01-09 DIAGNOSIS — C50911 Malignant neoplasm of unspecified site of right female breast: Secondary | ICD-10-CM | POA: Insufficient documentation

## 2021-01-09 DIAGNOSIS — Z79899 Other long term (current) drug therapy: Secondary | ICD-10-CM | POA: Diagnosis not present

## 2021-01-09 LAB — CBC AND DIFFERENTIAL
HCT: 41 (ref 36–46)
Hemoglobin: 13.5 (ref 12.0–16.0)
Neutrophils Absolute: 3.39
Platelets: 189 (ref 150–399)
WBC: 5.3

## 2021-01-09 LAB — BASIC METABOLIC PANEL
BUN: 14 (ref 4–21)
CO2: 27 — AB (ref 13–22)
Chloride: 103 (ref 99–108)
Creatinine: 0.7 (ref 0.5–1.1)
Glucose: 103
Potassium: 3.9 (ref 3.4–5.3)
Sodium: 138 (ref 137–147)

## 2021-01-09 LAB — TSH: TSH: 2.537 u[IU]/mL (ref 0.350–4.500)

## 2021-01-09 LAB — HEPATIC FUNCTION PANEL
ALT: 23 (ref 7–35)
AST: 28 (ref 13–35)
Alkaline Phosphatase: 64 (ref 25–125)
Bilirubin, Total: 0.6

## 2021-01-09 LAB — CBC: RBC: 4.54 (ref 3.87–5.11)

## 2021-01-09 LAB — COMPREHENSIVE METABOLIC PANEL
Albumin: 4.1 (ref 3.5–5.0)
Calcium: 9 (ref 8.7–10.7)

## 2021-01-09 MED ORDER — ZOLPIDEM TARTRATE 10 MG PO TABS: 10.0000 mg | ORAL_TABLET | Freq: Every evening | ORAL | 0 refills | Status: DC | PRN

## 2021-01-09 NOTE — Progress Notes (Signed)
The patient is a with newly diagnosed breast cancer  Patient presents to clinic today for chemotherapy education and palliative care consult.  We will start trastuzumab and pertuzumab.  We will send in prescriptions for prochlorperazine and ondansetron.  The patient verbalizes understanding of and agreement to the plan as discussed today.  Provided general information including the following: 1.  Date of education: 01-09-2021 2.  Physician name: Dr. Bobby Rumpf 3.  Diagnosis: Breast Cancer 4.  Stage: stage IA 5.  Curative 6.  Chemotherapy plan including drugs and how often: Trastuzumab; pertuzumab IV every 21 days 7.  Start date: 01-15-2021 8.  Other referrals: None at this time 9.  The patient is to call our office with any questions or concerns.  Our office number [phone], if after hours or on the weekend, call the same number and wait for the answering service.  There is always an oncologist on call 10.  Medications prescribed: prochlorperazine, ondansetron 11.  The patient has verbalized understanding of the treatment plan and has no barriers to adherence or understanding.  Obtained signed consent from patient.  Discussed symptoms including 1.  Low blood counts including red blood cells, white blood cells and platelets. 2. Infection including to avoid large crowds, wash hands frequently, and stay away from people who were sick.  If fever develops of 100.4 or higher, call our office. 3.  Mucositis-given instructions on mouth rinse (baking soda and salt mixture).  Keep mouth clean.  Use soft bristle toothbrush.  If mouth sores develop, call our clinic. 4.  Nausea/vomiting-gave prescriptions for ondansetron 4 mg every 4 hours as needed for nausea, may take around the clock if persistent.  Compazine 10 mg every 6 hours, may take around the clock if persistent. 5.  Diarrhea-use over-the-counter Imodium.  Call clinic if not controlled. 6.  Constipation-use senna, 1 to 2 tablets twice a day.  If  no BM in 2 to 3 days call the clinic. 7.  Loss of appetite-try to eat small meals every 2-3 hours.  Call clinic if not eating. 8.  Taste changes-zinc 500 mg daily.  If becomes severe call clinic. 9.  Alcoholic beverages. 10.  Drink 2 to 3 quarts of water per day. 11.  Peripheral neuropathy-patient to call if numbness or tingling in hands or feet is persistent   Gave information on the supportive care team and how to contact them regarding services.  Discussed advanced directives.  The patient does not have their advanced directives but will look at the copy provided in their notebook and will call with any questions. [employer] Social worker Advanced directives  Answered questions to patient satisfaction.  Patient is to call with any further questions or concerns.  Dayton Scrape, FNP- [facility]

## 2021-01-12 ENCOUNTER — Other Ambulatory Visit: Payer: Self-pay

## 2021-01-12 ENCOUNTER — Other Ambulatory Visit: Payer: Self-pay | Admitting: Hematology and Oncology

## 2021-01-12 DIAGNOSIS — Z171 Estrogen receptor negative status [ER-]: Secondary | ICD-10-CM

## 2021-01-12 DIAGNOSIS — C50411 Malignant neoplasm of upper-outer quadrant of right female breast: Secondary | ICD-10-CM

## 2021-01-12 MED ORDER — ONDANSETRON HCL 4 MG PO TABS: 4.0000 mg | ORAL_TABLET | ORAL | 3 refills | Status: AC | PRN

## 2021-01-12 MED ORDER — PROCHLORPERAZINE MALEATE 10 MG PO TABS: 10.0000 mg | ORAL_TABLET | Freq: Four times a day (QID) | ORAL | 3 refills | Status: DC | PRN

## 2021-01-12 NOTE — Progress Notes (Signed)
..  Pharmacist Chemotherapy Monitoring - Initial Assessment    Anticipated start date: 01/15/21  The following has been reviewed per standard work regarding the patient's treatment regimen: The patient's diagnosis, treatment plan and drug doses, and organ/hematologic function Lab orders and baseline tests specific to treatment regimen  The treatment plan start date, drug sequencing, and pre-medications Prior authorization status  Patient's documented medication list, including drug-drug interaction screen and prescriptions for anti-emetics and supportive care specific to the treatment regimen The drug concentrations, fluid compatibility, administration routes, and timing of the medications to be used The patient's access for treatment and lifetime cumulative dose history, if applicable  The patient's medication allergies and previous infusion related reactions, if applicable   Changes made to treatment plan:  Changed biosimilar to insurance preferred, add magnesium to labs.  Follow up needed:  N/A   Juanetta Beets, [facility], 01/12/2021  3:01 PM

## 2021-01-15 ENCOUNTER — Other Ambulatory Visit: Payer: Self-pay

## 2021-01-15 ENCOUNTER — Inpatient Hospital Stay: Payer: Managed Care, Other (non HMO)

## 2021-01-15 VITALS — BP 132/60 | HR 68 | Temp 98.1°F | Resp 18 | Ht 64.0 in | Wt 195.2 lb

## 2021-01-15 DIAGNOSIS — Z5112 Encounter for antineoplastic immunotherapy: Secondary | ICD-10-CM | POA: Diagnosis not present

## 2021-01-15 DIAGNOSIS — C50411 Malignant neoplasm of upper-outer quadrant of right female breast: Secondary | ICD-10-CM

## 2021-01-15 DIAGNOSIS — C50419 Malignant neoplasm of upper-outer quadrant of unspecified female breast: Secondary | ICD-10-CM

## 2021-01-15 DIAGNOSIS — Z171 Estrogen receptor negative status [ER-]: Secondary | ICD-10-CM

## 2021-01-15 MED ORDER — SODIUM CHLORIDE 0.9 % IV SOLN
Freq: Once | INTRAVENOUS | Status: AC
Start: 2021-01-15 — End: 2021-01-15
  Filled 2021-01-15: qty 250

## 2021-01-15 MED ORDER — HEPARIN SOD (PORK) LOCK FLUSH 100 UNIT/ML IV SOLN
500.0000 [IU] | Freq: Once | INTRAVENOUS | Status: AC | PRN
  Administered 2021-01-15: 500 [IU]
  Filled 2021-01-15: qty 5

## 2021-01-15 MED ORDER — DIPHENHYDRAMINE HCL 25 MG PO CAPS
ORAL_CAPSULE | ORAL | Status: AC
  Filled 2021-01-15: qty 2

## 2021-01-15 MED ORDER — TRASTUZUMAB-ANNS CHEMO 150 MG IV SOLR
8.0000 mg/kg | Freq: Once | INTRAVENOUS | Status: AC
  Administered 2021-01-15: 693 mg via INTRAVENOUS
  Filled 2021-01-15: qty 33

## 2021-01-15 MED ORDER — ACETAMINOPHEN 325 MG PO TABS
ORAL_TABLET | ORAL | Status: AC
  Filled 2021-01-15: qty 2

## 2021-01-15 MED ORDER — SODIUM CHLORIDE 0.9 % IV SOLN
840.0000 mg | Freq: Once | INTRAVENOUS | Status: AC
  Administered 2021-01-15: 840 mg via INTRAVENOUS
  Filled 2021-01-15: qty 28

## 2021-01-15 MED ORDER — ACETAMINOPHEN 325 MG PO TABS
650.0000 mg | ORAL_TABLET | Freq: Once | ORAL | Status: AC
  Administered 2021-01-15: 650 mg via ORAL

## 2021-01-15 MED ORDER — DIPHENHYDRAMINE HCL 25 MG PO CAPS
50.0000 mg | ORAL_CAPSULE | Freq: Once | ORAL | Status: AC
  Administered 2021-01-15: 50 mg via ORAL

## 2021-01-15 NOTE — Progress Notes (Signed)
1450: PT STABLE AT TIME OF DISCHARGE

## 2021-01-15 NOTE — Patient Instructions (Signed)
[facility]  Discharge Instructions: Thank you for choosing [facility] to provide your oncology and hematology care.  If you have a lab appointment with the [facility], please go directly to the [facility] and check in at the registration area.   Wear comfortable clothing and clothing appropriate for easy access to any Portacath or PICC line.   We strive to give you quality time with your provider. You may need to reschedule your appointment if you arrive late (15 or more minutes).  Arriving late affects you and other patients whose appointments are after yours.  Also, if you miss three or more appointments without notifying the office, you may be dismissed from the clinic at the provider's discretion.      For prescription refill requests, have your pharmacy contact our office and allow 72 hours for refills to be completed.    Today you received the following chemotherapy and/or immunotherapy agents Trastuzumab, Pertuzumab     To help prevent nausea and vomiting after your treatment, we encourage you to take your nausea medication as directed.  BELOW ARE SYMPTOMS THAT SHOULD BE REPORTED IMMEDIATELY: *FEVER GREATER THAN 100.4 F (38 C) OR HIGHER *CHILLS OR SWEATING *NAUSEA AND VOMITING THAT IS NOT CONTROLLED WITH YOUR NAUSEA MEDICATION *UNUSUAL SHORTNESS OF BREATH *UNUSUAL BRUISING OR BLEEDING *URINARY PROBLEMS (pain or burning when urinating, or frequent urination) *BOWEL PROBLEMS (unusual diarrhea, constipation, pain near the anus) TENDERNESS IN MOUTH AND THROAT WITH OR WITHOUT PRESENCE OF ULCERS (sore throat, sores in mouth, or a toothache) UNUSUAL RASH, SWELLING OR PAIN  UNUSUAL VAGINAL DISCHARGE OR ITCHING   Items with * indicate a potential emergency and should be followed up as soon as possible or go to the Emergency Department if any problems should occur.  Please show the CHEMOTHERAPY ALERT CARD or IMMUNOTHERAPY ALERT CARD at  check-in to the Emergency Department and triage nurse.  Should you have questions after your visit or need to cancel or reschedule your appointment, please contact [facility]  Dept: [phone]  and follow the prompts.  Office hours are 8:00 a.m. to 4:30 p.m. Monday - Friday. Please note that voicemails left after 4:00 p.m. may not be returned until the following business day.  We are closed weekends and major holidays. You have access to a nurse at all times for urgent questions. Please call the main number to the clinic Dept: [phone] and follow the prompts.  For any non-urgent questions, you may also contact your provider using MyChart. We now offer e-Visits for anyone 25 and older to request care online for non-urgent symptoms. For details visit [email or web address].   Also download the MyChart app! Go to the app store, search "MyChart", open the app, select [facility], and log in with your MyChart username and password.  Due to Covid, a mask is required upon entering the hospital/clinic. If you do not have a mask, one will be given to you upon arrival. For doctor visits, patients may have 1 support person aged 53 or older with them. For treatment visits, patients cannot have anyone with them due to current Covid guidelines and our immunocompromised population.

## 2021-01-16 ENCOUNTER — Telehealth: Payer: Self-pay

## 2021-01-16 NOTE — Telephone Encounter (Signed)
I spoke with pt. She states she had nausea on the ride home yesterday and last night. She took Zofran and it helped. No emesis. She also reported achiness and low grade fever of 99.6 last night. No achiness, fever, diarrhea, skin reactions, nor nausea today.  I reminded pt to call us if temp is 100.4 or higher, DAY OR NIGHT. I confirmed her next appt. I encouraged her to call us with any questions/concerns.  Pt verbalized understanding.

## 2021-01-17 ENCOUNTER — Encounter: Payer: Self-pay | Admitting: Oncology

## 2021-02-02 ENCOUNTER — Ambulatory Visit: Payer: Managed Care, Other (non HMO)

## 2021-02-05 ENCOUNTER — Other Ambulatory Visit: Payer: Self-pay | Admitting: Hematology and Oncology

## 2021-02-05 ENCOUNTER — Inpatient Hospital Stay: Payer: Managed Care, Other (non HMO) | Attending: Oncology | Admitting: Hematology and Oncology

## 2021-02-05 ENCOUNTER — Other Ambulatory Visit: Payer: Managed Care, Other (non HMO)

## 2021-02-05 ENCOUNTER — Inpatient Hospital Stay: Payer: Managed Care, Other (non HMO)

## 2021-02-05 ENCOUNTER — Encounter: Payer: Self-pay | Admitting: Hematology and Oncology

## 2021-02-05 VITALS — BP 120/70 | HR 80 | Temp 98.5°F | Resp 16 | Ht 64.0 in | Wt 194.9 lb

## 2021-02-05 DIAGNOSIS — C50412 Malignant neoplasm of upper-outer quadrant of left female breast: Secondary | ICD-10-CM

## 2021-02-05 DIAGNOSIS — Z86711 Personal history of pulmonary embolism: Secondary | ICD-10-CM | POA: Insufficient documentation

## 2021-02-05 DIAGNOSIS — Z7901 Long term (current) use of anticoagulants: Secondary | ICD-10-CM | POA: Insufficient documentation

## 2021-02-05 DIAGNOSIS — Z171 Estrogen receptor negative status [ER-]: Secondary | ICD-10-CM | POA: Diagnosis not present

## 2021-02-05 DIAGNOSIS — C50911 Malignant neoplasm of unspecified site of right female breast: Secondary | ICD-10-CM | POA: Insufficient documentation

## 2021-02-05 DIAGNOSIS — Z79899 Other long term (current) drug therapy: Secondary | ICD-10-CM | POA: Insufficient documentation

## 2021-02-05 DIAGNOSIS — C50411 Malignant neoplasm of upper-outer quadrant of right female breast: Secondary | ICD-10-CM

## 2021-02-05 DIAGNOSIS — Z5112 Encounter for antineoplastic immunotherapy: Secondary | ICD-10-CM | POA: Insufficient documentation

## 2021-02-05 DIAGNOSIS — Z86718 Personal history of other venous thrombosis and embolism: Secondary | ICD-10-CM | POA: Insufficient documentation

## 2021-02-05 LAB — BASIC METABOLIC PANEL
BUN: 18 (ref 4–21)
CO2: 26 — AB (ref 13–22)
Chloride: 107 (ref 99–108)
Creatinine: 0.8 (ref 0.5–1.1)
Glucose: 95
Potassium: 3.5 (ref 3.4–5.3)
Sodium: 141 (ref 137–147)

## 2021-02-05 LAB — CBC AND DIFFERENTIAL
HCT: 39 (ref 36–46)
Hemoglobin: 12.9 (ref 12.0–16.0)
Neutrophils Absolute: 5.78
Platelets: 208 (ref 150–399)
WBC: 7.7

## 2021-02-05 LAB — COMPREHENSIVE METABOLIC PANEL
Albumin: 4 (ref 3.5–5.0)
Calcium: 8.8 (ref 8.7–10.7)

## 2021-02-05 LAB — HEPATIC FUNCTION PANEL
ALT: 18 (ref 7–35)
AST: 18 (ref 13–35)
Alkaline Phosphatase: 68 (ref 25–125)
Bilirubin, Total: 0.3

## 2021-02-05 LAB — CBC: RBC: 4.32 (ref 3.87–5.11)

## 2021-02-05 MED ORDER — AZITHROMYCIN 250 MG PO TABS: ORAL_TABLET | ORAL | 0 refills | Status: DC

## 2021-02-05 NOTE — Progress Notes (Signed)
[facility]  [address] [place],  [place]  [address] [phone]  Clinic Day:  02/05/2021  Referring physician: Orrin Brigham, MD   HISTORY OF PRESENT ILLNESS:  The patient is a 60 y.o. 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  Although her tumor is small, she understands that its Her2 positivity is significant and warrants adjuvant therapy to be given. We came to the mutual decision to give her adjuvant Perjeta/Herceptin, which will be given once every 3 weeks for a full year. She will also undergo adjuvant breast radiation to help prevent local disease recurrence.  She will have a port placed through which all of her Her2 therapy will be given.   She presents today prior to cycle 2 Her2 therapy. She tolerated cycle 1 well with mild nausea and diarrhea. She has a cough with light green sputum. She has been tested for COVID multiple times and all are negative. She denies fever, chills, shortness of breath or chest pain. She continues with radiation therapy without difficulty. CBC and CMP are unremarkable.  PAST MEDICAL HISTORY:   Past Medical History:  Diagnosis Date   Anemia    Anxiety    Arthritis    wrists - tx with otc med prn   Breast cancer ([facility])    Depression    DVT (deep venous thrombosis) ([facility])    Kidney stones    no surgery required - passed stones   Leiomyoma    Migraines    Pulmonary embolism ([facility]) 1989   after c/s POST PARTUM   Seasonal allergies     PAST SURGICAL HISTORY:   Past Surgical History:  Procedure Laterality Date   CESAREAN SECTION     X3   DILATATION & CURETTAGE/HYSTEROSCOPY WITH TRUECLEAR N/A 05/10/2013   Procedure: DILATATION & CURETTAGE/HYSTEROSCOPY WITH TRUECLEAR ;  Surgeon: Anastasio Auerbach, MD;  Location: [facility];  Service: Gynecology;  Laterality: N/A;   TUBAL LIGATION     WISDOM TOOTH EXTRACTION      CURRENT MEDICATIONS:   Current Outpatient Medications  Medication Sig Dispense  Refill   azithromycin (ZITHROMAX Z-PAK) 250 MG tablet Take as directed on packet 6 each 0   apixaban (ELIQUIS) 5 MG TABS tablet Take 5 mg by mouth 2 (two) times daily.     buPROPion (WELLBUTRIN XL) 300 MG 24 hr tablet Take 300 mg by mouth daily.     Dermatological Products, Misc. (STRATA XRT) GEL SMARTSIG:Sparingly Topical Twice Daily     fluticasone (FLONASE) 50 MCG/ACT nasal spray Place 2 sprays into the nose daily.     ondansetron (ZOFRAN) 4 MG tablet Take 1 tablet (4 mg total) by mouth every 4 (four) hours as needed for nausea. 90 tablet 3   ondansetron (ZOFRAN) 8 MG tablet Take 8 mg by mouth every 8 (eight) hours as needed.     oxyCODONE (OXY IR/ROXICODONE) 5 MG immediate release tablet Take 5 mg by mouth every 4 (four) hours as needed.     prochlorperazine (COMPAZINE) 10 MG tablet Take 1 tablet (10 mg total) by mouth every 6 (six) hours as needed for nausea or vomiting. 90 tablet 3   SUMAtriptan (IMITREX) 50 MG tablet Take 50 mg by mouth as needed.     zolpidem (AMBIEN) 10 MG tablet Take 1 tablet (10 mg total) by mouth at bedtime as needed for sleep. 30 tablet 0   No current facility-administered medications for this visit.    ALLERGIES:  No Known Allergies  FAMILY HISTORY:   Family History  Problem Relation Age of Onset   Stroke Mother    Deep vein thrombosis Mother    Pulmonary embolism Mother    Cancer Maternal Grandfather        Throat cancer    SOCIAL HISTORY:  The patient was born in [place], but raised locally.  [place] her husband of 75 years.  They have 3 children and 2 grandchildren. 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  There is no history of alcohol or tobacco use.    REVIEW OF SYSTEMS:  Review of Systems  Constitutional:  Negative for appetite change, chills, diaphoresis, fatigue, fever and unexpected weight change.       Occasional hot flashes  HENT:   Positive for sore throat. Negative for hearing loss, lump/mass, mouth sores,  nosebleeds, tinnitus, trouble swallowing and voice change.   Eyes:  Negative for eye problems and icterus.  Respiratory:  Positive for cough. Negative for chest tightness, hemoptysis, shortness of breath and wheezing.   Cardiovascular:  Negative for chest pain, leg swelling and palpitations.  Gastrointestinal:  Positive for diarrhea and nausea. Negative for abdominal distention, abdominal pain, blood in stool, constipation, rectal pain and vomiting.  Endocrine: Negative for hot flashes.  Genitourinary:  Negative for bladder incontinence, difficulty urinating, dyspareunia, dysuria, frequency, hematuria and nocturia.   Musculoskeletal:  Negative for arthralgias, back pain, flank pain, gait problem, myalgias, neck pain and neck stiffness.  Skin: Negative.  Negative for itching, rash and wound.  Neurological: Negative.  Negative for dizziness, extremity weakness, gait problem, headaches, light-headedness, numbness, seizures and speech difficulty.  Hematological: Negative.  Negative for adenopathy. Does not bruise/bleed easily.  Psychiatric/Behavioral: Negative.  Negative for confusion, decreased concentration, depression, sleep disturbance and suicidal ideas. The patient is not nervous/anxious.     PHYSICAL EXAM:  Blood pressure 120/70, pulse 80, temperature 98.5 F (36.9 C), temperature source Oral, resp. rate 16, height 5' 4"  (1.626 m), weight 194 lb 14.4 oz (88.4 kg), last menstrual period 10/25/2015, SpO2 96 %. Wt Readings from Last 3 Encounters:  02/05/21 194 lb 14.4 oz (88.4 kg)  01/15/21 195 lb 4 oz (88.6 kg)  01/09/21 194 lb 3.2 oz (88.1 kg)   Body mass index is 33.45 kg/m. Performance status (ECOG): 0 - Asymptomatic Physical Exam Constitutional:      Appearance: Normal appearance.  HENT:     Mouth/Throat:     Pharynx: Oropharynx is clear. No oropharyngeal exudate.  Cardiovascular:     Rate and Rhythm: Normal rate and regular rhythm.     Heart sounds: No murmur heard.   No  friction rub. No gallop.  Pulmonary:     Breath sounds: Normal breath sounds.  Chest:  Breasts:    Right: No swelling, bleeding, inverted nipple, mass, nipple discharge, skin change, axillary adenopathy or supraclavicular adenopathy.     Left: No swelling, bleeding, inverted nipple, mass, nipple discharge, skin change, axillary adenopathy or supraclavicular adenopathy.     Comments: Right lumpectomy site is healing well from recent surgery  Abdominal:     General: Bowel sounds are normal. There is no distension.     Palpations: Abdomen is soft. There is no mass.     Tenderness: There is no abdominal tenderness.  Musculoskeletal:        General: No tenderness.     Cervical back: Normal range of motion and neck supple.     Right lower  leg: No edema.     Left lower leg: No edema.  Lymphadenopathy:     Cervical: No cervical adenopathy.     Right cervical: No superficial, deep or posterior cervical adenopathy.    Left cervical: No superficial, deep or posterior cervical adenopathy.     Upper Body:     Right upper body: No supraclavicular or axillary adenopathy.     Left upper body: No supraclavicular or axillary adenopathy.     Lower Body: No right inguinal adenopathy. No left inguinal adenopathy.  Skin:    Coloration: Skin is not jaundiced.     Findings: No lesion or rash.  Neurological:     General: No focal deficit present.     Mental Status: She is alert and oriented to person, place, and time. Mental status is at baseline.  Psychiatric:        Mood and Affect: Mood normal.        Behavior: Behavior normal.        Thought Content: Thought content normal.        Judgment: Judgment normal.    ASSESSMENT & PLAN:  A 61 y.o. 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  She will proceed with cycle 2 Herceptin/Perjeta tomorrow. She will continue daily radiation. I will send in zithromax for upper respiratory symptoms. She will return to clinic in 3 weeks for repeat  evaluation.   She verbalizes understanding of and agreement to the plans discussed today. She knows to call the office should any new questions or concerns arise.    Melodye Ped, NP

## 2021-02-06 ENCOUNTER — Other Ambulatory Visit: Payer: Self-pay

## 2021-02-06 ENCOUNTER — Inpatient Hospital Stay: Payer: Managed Care, Other (non HMO)

## 2021-02-06 VITALS — BP 120/67 | HR 74 | Temp 97.8°F | Resp 18 | Ht 64.0 in | Wt 192.1 lb

## 2021-02-06 DIAGNOSIS — Z86718 Personal history of other venous thrombosis and embolism: Secondary | ICD-10-CM | POA: Diagnosis not present

## 2021-02-06 DIAGNOSIS — Z5112 Encounter for antineoplastic immunotherapy: Secondary | ICD-10-CM | POA: Diagnosis not present

## 2021-02-06 DIAGNOSIS — Z79899 Other long term (current) drug therapy: Secondary | ICD-10-CM | POA: Diagnosis not present

## 2021-02-06 DIAGNOSIS — C50419 Malignant neoplasm of upper-outer quadrant of unspecified female breast: Secondary | ICD-10-CM

## 2021-02-06 DIAGNOSIS — Z7901 Long term (current) use of anticoagulants: Secondary | ICD-10-CM | POA: Diagnosis not present

## 2021-02-06 DIAGNOSIS — Z86711 Personal history of pulmonary embolism: Secondary | ICD-10-CM | POA: Diagnosis not present

## 2021-02-06 DIAGNOSIS — Z171 Estrogen receptor negative status [ER-]: Secondary | ICD-10-CM

## 2021-02-06 DIAGNOSIS — C50911 Malignant neoplasm of unspecified site of right female breast: Secondary | ICD-10-CM | POA: Diagnosis present

## 2021-02-06 MED ORDER — SODIUM CHLORIDE 0.9 % IV SOLN
420.0000 mg | Freq: Once | INTRAVENOUS | Status: AC
  Administered 2021-02-06: 420 mg via INTRAVENOUS
  Filled 2021-02-06: qty 14

## 2021-02-06 MED ORDER — TRASTUZUMAB-ANNS CHEMO 150 MG IV SOLR
6.0000 mg/kg | Freq: Once | INTRAVENOUS | Status: AC
  Administered 2021-02-06: 525 mg via INTRAVENOUS
  Filled 2021-02-06: qty 25

## 2021-02-06 MED ORDER — SODIUM CHLORIDE 0.9 % IV SOLN
Freq: Once | INTRAVENOUS | Status: AC
  Filled 2021-02-06: qty 250

## 2021-02-06 MED ORDER — ACETAMINOPHEN 325 MG PO TABS: 650.0000 mg | ORAL_TABLET | Freq: Once | ORAL | Status: DC

## 2021-02-06 MED ORDER — DIPHENHYDRAMINE HCL 25 MG PO CAPS: 50.0000 mg | ORAL_CAPSULE | Freq: Once | ORAL | Status: DC

## 2021-02-06 NOTE — Patient Instructions (Signed)
[facility]  Discharge Instructions: Thank you for choosing [facility] to provide your oncology and hematology care.  If you have a lab appointment with the [facility], please go directly to the [facility] and check in at the registration area.   Wear comfortable clothing and clothing appropriate for easy access to any Portacath or PICC line.   We strive to give you quality time with your provider. You may need to reschedule your appointment if you arrive late (15 or more minutes).  Arriving late affects you and other patients whose appointments are after yours.  Also, if you miss three or more appointments without notifying the office, you may be dismissed from the clinic at the provider's discretion.      For prescription refill requests, have your pharmacy contact our office and allow 72 hours for refills to be completed.    Today you received the following chemotherapy and/or immunotherapy agents trastuzumab/perjeta     To help prevent nausea and vomiting after your treatment, we encourage you to take your nausea medication as directed.  BELOW ARE SYMPTOMS THAT SHOULD BE REPORTED IMMEDIATELY: *FEVER GREATER THAN 100.4 F (38 C) OR HIGHER *CHILLS OR SWEATING *NAUSEA AND VOMITING THAT IS NOT CONTROLLED WITH YOUR NAUSEA MEDICATION *UNUSUAL SHORTNESS OF BREATH *UNUSUAL BRUISING OR BLEEDING *URINARY PROBLEMS (pain or burning when urinating, or frequent urination) *BOWEL PROBLEMS (unusual diarrhea, constipation, pain near the anus) TENDERNESS IN MOUTH AND THROAT WITH OR WITHOUT PRESENCE OF ULCERS (sore throat, sores in mouth, or a toothache) UNUSUAL RASH, SWELLING OR PAIN  UNUSUAL VAGINAL DISCHARGE OR ITCHING   Items with * indicate a potential emergency and should be followed up as soon as possible or go to the Emergency Department if any problems should occur.  Please show the CHEMOTHERAPY ALERT CARD or IMMUNOTHERAPY ALERT CARD at check-in to  the Emergency Department and triage nurse.  Should you have questions after your visit or need to cancel or reschedule your appointment, please contact [facility]  Dept: [phone]  and follow the prompts.  Office hours are 8:00 a.m. to 4:30 p.m. Monday - Friday. Please note that voicemails left after 4:00 p.m. may not be returned until the following business day.  We are closed weekends and major holidays. You have access to a nurse at all times for urgent questions. Please call the main number to the clinic Dept: [phone] and follow the prompts.  For any non-urgent questions, you may also contact your provider using MyChart. We now offer e-Visits for anyone 60 and older to request care online for non-urgent symptoms. For details visit [email or web address].   Also download the MyChart app! Go to the app store, search "MyChart", open the app, select [facility], and log in with your MyChart username and password.  Due to Covid, a mask is required upon entering the hospital/clinic. If you do not have a mask, one will be given to you upon arrival. For doctor visits, patients may have 1 support person aged 49 or older with them. For treatment visits, patients cannot have anyone with them due to current Covid guidelines and our immunocompromised population.   Trastuzumab injection for infusion What is this medication? TRASTUZUMAB (tras TOO zoo mab) is a monoclonal antibody. It is used to treatbreast cancer and stomach cancer. This medicine may be used for other purposes; ask your health care provider orpharmacist if you have questions. COMMON BRAND NAME(S): Herceptin, Belenda Cruise, Ogivri, Ontruzant,  Trazimera What should I tell my care team before I take this medication? They need to know if you have any of these conditions: heart disease heart failure lung or breathing disease, like asthma an unusual or allergic reaction to trastuzumab, benzyl alcohol, or  other medications, foods, dyes, or preservatives pregnant or trying to get pregnant breast-feeding How should I use this medication? This drug is given as an infusion into a vein. It is administered in a hospitalor clinic by a specially trained health care professional. Talk to your pediatrician regarding the use of this medicine in children. Thismedicine is not approved for use in children. Overdosage: If you think you have taken too much of this medicine contact apoison control center or emergency room at once. NOTE: This medicine is only for you. Do not share this medicine with others. What if I miss a dose? It is important not to miss a dose. Call your doctor or health careprofessional if you are unable to keep an appointment. What may interact with this medication? This medicine may interact with the following medications: certain types of chemotherapy, such as daunorubicin, doxorubicin, epirubicin, and idarubicin This list may not describe all possible interactions. Give your health care provider a list of all the medicines, herbs, non-prescription drugs, or dietary supplements you use. Also tell them if you smoke, drink alcohol, or use illegaldrugs. Some items may interact with your medicine. What should I watch for while using this medication? Visit your doctor for checks on your progress. Report any side effects. Continue your course of treatment even though you feel ill unless your doctortells you to stop. Call your doctor or health care professional for advice if you get a fever, chills or sore throat, or other symptoms of a cold or flu. Do not treatyourself. Try to avoid being around people who are sick. You may experience fever, chills and shaking during your first infusion. These effects are usually mild and can be treated with other medicines. Report any side effects during the infusion to your health care professional. Fever andchills usually do not happen with later infusions. Do  not become pregnant while taking this medicine or for 7 months after stopping it. Women should inform their doctor if they wish to become pregnant or think they might be pregnant. Women of child-bearing potential will need to have a negative pregnancy test before starting this medicine. There is a potential for serious side effects to an unborn child. Talk to your health care professional or pharmacist for more information. Do not breast-feed an infantwhile taking this medicine or for 7 months after stopping it. Women must use effective birth control with this medicine. What side effects may I notice from receiving this medication? Side effects that you should report to your doctor or health care professionalas soon as possible: allergic reactions like skin rash, itching or hives, swelling of the face, lips, or tongue chest pain or palpitations cough dizziness feeling faint or lightheaded, falls fever general ill feeling or flu-like symptoms signs of worsening heart failure like breathing problems; swelling in your legs and feet unusually weak or tired Side effects that usually do not require medical attention (report to yourdoctor or health care professional if they continue or are bothersome): bone pain changes in taste diarrhea joint pain nausea/vomiting weight loss This list may not describe all possible side effects. Call your doctor for medical advice about side effects. You may report side effects to FDA at1-800-FDA-1088. Where should I keep my medication? This drug  is given in a hospital or clinic and will not be stored at home. NOTE: This sheet is a summary. It may not cover all possible information. If you have questions about this medicine, talk to your doctor, pharmacist, orhealth care provider.  2022 Elsevier/Gold Standard (2016-06-18 14:37:52) Pertuzumab injection What is this medication? PERTUZUMAB (per TOOZ ue mab) is a monoclonal antibody. It is used to treatbreast  cancer. This medicine may be used for other purposes; ask your health care provider orpharmacist if you have questions. COMMON BRAND NAME(S): PERJETA What should I tell my care team before I take this medication? They need to know if you have any of these conditions: heart disease heart failure high blood pressure history of irregular heart beat recent or ongoing radiation therapy an unusual or allergic reaction to pertuzumab, other medicines, foods, dyes, or preservatives pregnant or trying to get pregnant breast-feeding How should I use this medication? This medicine is for infusion into a vein. It is given by a health careprofessional in a hospital or clinic setting. Talk to your pediatrician regarding the use of this medicine in children.Special care may be needed. Overdosage: If you think you have taken too much of this medicine contact apoison control center or emergency room at once. NOTE: This medicine is only for you. Do not share this medicine with others. What if I miss a dose? It is important not to miss your dose. Call your doctor or health careprofessional if you are unable to keep an appointment. What may interact with this medication? Interactions are not expected. Give your health care provider a list of all the medicines, herbs, non-prescription drugs, or dietary supplements you use. Also tell them if you smoke, drink alcohol, or use illegal drugs. Some items may interact with yourmedicine. This list may not describe all possible interactions. Give your health care provider a list of all the medicines, herbs, non-prescription drugs, or dietary supplements you use. Also tell them if you smoke, drink alcohol, or use illegaldrugs. Some items may interact with your medicine. What should I watch for while using this medication? Your condition will be monitored carefully while you are receiving this medicine. Report any side effects. Continue your course of treatment eventhough  you feel ill unless your doctor tells you to stop. Do not become pregnant while taking this medicine or for 7 months after stopping it. Women should inform their doctor if they wish to become pregnant or think they might be pregnant. Women of child-bearing potential will need to have a negative pregnancy test before starting this medicine. There is a potential for serious side effects to an unborn child. Talk to your health care professional or pharmacist for more information. Do not breast-feed an infantwhile taking this medicine or for 7 months after stopping it. Women must use effective birth control with this medicine. Call your doctor or health care professional for advice if you get a fever, chills or sore throat, or other symptoms of a cold or flu. Do not treatyourself. Try to avoid being around people who are sick. You may experience fever, chills, and headache during the infusion. Report anyside effects during the infusion to your health care professional. What side effects may I notice from receiving this medication? Side effects that you should report to your doctor or health care professionalas soon as possible: breathing problems chest pain or palpitations dizziness feeling faint or lightheaded fever or chills skin rash, itching or hives sore throat swelling of the face, lips, or tongue  swelling of the legs or ankles unusually weak or tired Side effects that usually do not require medical attention (report to yourdoctor or health care professional if they continue or are bothersome): diarrhea hair loss nausea, vomiting tiredness This list may not describe all possible side effects. Call your doctor for medical advice about side effects. You may report side effects to FDA at1-800-FDA-1088. Where should I keep my medication? This drug is given in a hospital or clinic and will not be stored at home. NOTE: This sheet is a summary. It may not cover all possible information. If you  have questions about this medicine, talk to your doctor, pharmacist, orhealth care provider.  2022 Elsevier/Gold Standard (2015-07-27 12:08:50)

## 2021-02-07 ENCOUNTER — Encounter: Payer: Self-pay | Admitting: Oncology

## 2021-02-19 NOTE — Progress Notes (Signed)
[facility]  [address] [place],  [place]  [address] [phone]  Clinic Day:  02/26/2021  Referring physician: Orrin Brigham, MD  This document serves as a record of services personally performed by Dequincy Macarthur Critchley, MD. It was created on their behalf by [facility], a trained medical scribe. The creation of this record is based on the scribe's personal observations and the provider's statements to them.  HISTORY OF PRESENT ILLNESS:  The patient is a 60 y.o. 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  She is taking adjuvant Perjeta/Herceptin, which is being given once every 3 weeks for a full year.  She presents today prior to her 3rd cycle of adjuvant therapy. She tolerated cycle 2 without any significant difficulty.  She recently completed her adjuvant breast radiation, which she tolerated her adjuvant breast radiation very well.  She denies having any particular changes in her breasts which concern her for disease recurrence.    PHYSICAL EXAM:  Blood pressure (!) 117/59, pulse 77, temperature 98.7 F (37.1 C), resp. rate 16, height 5' 4" (1.626 m), weight 192 lb 6.4 oz (87.3 kg), last menstrual period 10/25/2015, SpO2 98 %. Wt Readings from Last 3 Encounters:  02/26/21 192 lb 6.4 oz (87.3 kg)  02/06/21 192 lb 1.9 oz (87.1 kg)  02/05/21 194 lb 14.4 oz (88.4 kg)   Body mass index is 33.03 kg/m. Performance status (ECOG): 0 - Asymptomatic Physical Exam Constitutional:      General: She is not in acute distress.    Appearance: Normal appearance. She is normal weight.  HENT:     Head: Normocephalic and atraumatic.  Eyes:     General: No scleral icterus.    Extraocular Movements: Extraocular movements intact.     Conjunctiva/sclera: Conjunctivae normal.     Pupils: Pupils are equal, round, and reactive to light.  Cardiovascular:     Rate and Rhythm: Normal rate and regular rhythm.     Pulses: Normal pulses.     Heart  sounds: Normal heart sounds. No murmur heard.   No friction rub. No gallop.  Pulmonary:     Effort: Pulmonary effort is normal. No respiratory distress.     Breath sounds: Normal breath sounds.  Abdominal:     General: Bowel sounds are normal. There is no distension.     Palpations: Abdomen is soft. There is no hepatomegaly, splenomegaly or mass.     Tenderness: There is no abdominal tenderness.  Musculoskeletal:        General: Normal range of motion.     Cervical back: Normal range of motion and neck supple.     Right lower leg: No edema.     Left lower leg: No edema.  Lymphadenopathy:     Cervical: No cervical adenopathy.  Skin:    General: Skin is warm and dry.  Neurological:     General: No focal deficit present.     Mental Status: She is alert and oriented to person, place, and time. Mental status is at baseline.  Psychiatric:        Mood and Affect: Mood normal.        Behavior: Behavior normal.        Thought Content: Thought content normal.        Judgment: Judgment normal.   LABS: Results for Theresa, Castro (MRN [id number]) as of 02/26/2021 11:11  Ref. Range 02/26/2021 00:00  Sodium Latest Ref Range: 137 - 147  140  Potassium Latest Ref Range: 3.4 - 5.3  3.9  Chloride Latest Ref Range: 99 - 108  106  CO2 Latest Ref Range: 13 - 22  26 (A)  Glucose Unknown 102  BUN Latest Ref Range: 4 - 21  16  Creatinine Latest Ref Range: 0.5 - 1.1  0.9  Calcium Latest Ref Range: 8.7 - 10.7  8.6 (A)  Alkaline Phosphatase Latest Ref Range: 25 - 125  69  Albumin Latest Ref Range: 3.5 - 5.0  3.9  AST Latest Ref Range: 13 - 35  31  ALT Latest Ref Range: 7 - 35  31  Bilirubin, Total Unknown 0.8  WBC Unknown 3.5  RBC Latest Ref Range: 3.87 - 5.11  4.15  Hemoglobin Latest Ref Range: 12.0 - 16.0  12.5  HCT Latest Ref Range: 36 - 46  38  Platelets Latest Ref Range: 150 - 399  145 (A)  NEUT# Unknown 2.28   ASSESSMENT & PLAN:  A 61 y.o. female with stage IA (T1b N0 M0) her2 positive  breast cancer, who has already completed her adjuvant breast radiation.  She will proceed with cycle 3 of Herceptin/Perjeta tomorrow. Clinically, she is doing well.  I will see her back in 3 weeks before she heads into her 4th cycle of adjuvant Herceptin/Perjeta.  The patient understands all the plans discussed today and is in agreement with them.    I, Rita Ohara, am acting as scribe for Marice Potter, MD    I have reviewed this report as typed by the medical scribe, and it is complete and accurate.  Dequincy Macarthur Critchley, MD

## 2021-02-25 ENCOUNTER — Other Ambulatory Visit: Payer: Self-pay | Admitting: Oncology

## 2021-02-26 ENCOUNTER — Inpatient Hospital Stay: Payer: Managed Care, Other (non HMO)

## 2021-02-26 ENCOUNTER — Inpatient Hospital Stay (INDEPENDENT_AMBULATORY_CARE_PROVIDER_SITE_OTHER): Payer: Managed Care, Other (non HMO) | Admitting: Oncology

## 2021-02-26 ENCOUNTER — Encounter: Payer: Self-pay | Admitting: Oncology

## 2021-02-26 ENCOUNTER — Other Ambulatory Visit: Payer: Self-pay | Admitting: Oncology

## 2021-02-26 ENCOUNTER — Other Ambulatory Visit: Payer: Self-pay

## 2021-02-26 VITALS — BP 117/59 | HR 77 | Temp 98.7°F | Resp 16 | Ht 64.0 in | Wt 192.4 lb

## 2021-02-26 DIAGNOSIS — C50411 Malignant neoplasm of upper-outer quadrant of right female breast: Secondary | ICD-10-CM

## 2021-02-26 DIAGNOSIS — Z171 Estrogen receptor negative status [ER-]: Secondary | ICD-10-CM

## 2021-02-26 DIAGNOSIS — C50419 Malignant neoplasm of upper-outer quadrant of unspecified female breast: Secondary | ICD-10-CM

## 2021-02-26 LAB — CBC AND DIFFERENTIAL
HCT: 38 (ref 36–46)
Hemoglobin: 12.5 (ref 12.0–16.0)
Neutrophils Absolute: 2.28
Platelets: 145 — AB (ref 150–399)
WBC: 3.5

## 2021-02-26 LAB — BASIC METABOLIC PANEL
BUN: 16 (ref 4–21)
CO2: 26 — AB (ref 13–22)
Chloride: 106 (ref 99–108)
Creatinine: 0.9 (ref 0.5–1.1)
Glucose: 102
Potassium: 3.9 (ref 3.4–5.3)
Sodium: 140 (ref 137–147)

## 2021-02-26 LAB — HEPATIC FUNCTION PANEL
ALT: 31 (ref 7–35)
AST: 31 (ref 13–35)
Alkaline Phosphatase: 69 (ref 25–125)
Bilirubin, Total: 0.8

## 2021-02-26 LAB — CBC: RBC: 4.15 (ref 3.87–5.11)

## 2021-02-26 LAB — COMPREHENSIVE METABOLIC PANEL
Albumin: 3.9 (ref 3.5–5.0)
Calcium: 8.6 — AB (ref 8.7–10.7)

## 2021-02-27 ENCOUNTER — Inpatient Hospital Stay: Payer: Managed Care, Other (non HMO)

## 2021-02-27 ENCOUNTER — Ambulatory Visit: Payer: Managed Care, Other (non HMO)

## 2021-02-27 VITALS — BP 102/61 | HR 88 | Temp 98.1°F | Resp 18 | Ht 64.0 in | Wt 192.5 lb

## 2021-02-27 DIAGNOSIS — C50419 Malignant neoplasm of upper-outer quadrant of unspecified female breast: Secondary | ICD-10-CM

## 2021-02-27 DIAGNOSIS — Z5112 Encounter for antineoplastic immunotherapy: Secondary | ICD-10-CM | POA: Diagnosis not present

## 2021-02-27 DIAGNOSIS — Z171 Estrogen receptor negative status [ER-]: Secondary | ICD-10-CM

## 2021-02-27 MED ORDER — HEPARIN SOD (PORK) LOCK FLUSH 100 UNIT/ML IV SOLN
500.0000 [IU] | Freq: Once | INTRAVENOUS | Status: AC | PRN
  Administered 2021-02-27: 500 [IU]

## 2021-02-27 MED ORDER — ACETAMINOPHEN 325 MG PO TABS: 650.0000 mg | ORAL_TABLET | Freq: Once | ORAL | Status: DC

## 2021-02-27 MED ORDER — TRASTUZUMAB-ANNS CHEMO 150 MG IV SOLR
6.0000 mg/kg | Freq: Once | INTRAVENOUS | Status: AC
  Administered 2021-02-27: 525 mg via INTRAVENOUS
  Filled 2021-02-27: qty 25

## 2021-02-27 MED ORDER — SODIUM CHLORIDE 0.9 % IV SOLN: Freq: Once | INTRAVENOUS | Status: AC

## 2021-02-27 MED ORDER — SODIUM CHLORIDE 0.9 % IV SOLN
420.0000 mg | Freq: Once | INTRAVENOUS | Status: AC
  Administered 2021-02-27: 420 mg via INTRAVENOUS
  Filled 2021-02-27: qty 14

## 2021-02-27 MED ORDER — DIPHENHYDRAMINE HCL 25 MG PO CAPS: 50.0000 mg | ORAL_CAPSULE | Freq: Once | ORAL | Status: DC

## 2021-02-27 MED ORDER — SODIUM CHLORIDE 0.9% FLUSH
10.0000 mL | INTRAVENOUS | Status: DC | PRN
  Administered 2021-02-27: 10 mL

## 2021-02-27 NOTE — Patient Instructions (Signed)
[facility]  Discharge Instructions: Thank you for choosing [facility] to provide your oncology and hematology care.  If you have a lab appointment with the [facility], please go directly to the [facility] and check in at the registration area.   Wear comfortable clothing and clothing appropriate for easy access to any Portacath or PICC line.   We strive to give you quality time with your provider. You may need to reschedule your appointment if you arrive late (15 or more minutes).  Arriving late affects you and other patients whose appointments are after yours.  Also, if you miss three or more appointments without notifying the office, you may be dismissed from the clinic at the provider's discretion.      For prescription refill requests, have your pharmacy contact our office and allow 72 hours for refills to be completed.    Today you received the following chemotherapy and/or immunotherapy agents trastuzumab/perjeta    To help prevent nausea and vomiting after your treatment, we encourage you to take your nausea medication as directed.  BELOW ARE SYMPTOMS THAT SHOULD BE REPORTED IMMEDIATELY: *FEVER GREATER THAN 100.4 F (38 C) OR HIGHER *CHILLS OR SWEATING *NAUSEA AND VOMITING THAT IS NOT CONTROLLED WITH YOUR NAUSEA MEDICATION *UNUSUAL SHORTNESS OF BREATH *UNUSUAL BRUISING OR BLEEDING *URINARY PROBLEMS (pain or burning when urinating, or frequent urination) *BOWEL PROBLEMS (unusual diarrhea, constipation, pain near the anus) TENDERNESS IN MOUTH AND THROAT WITH OR WITHOUT PRESENCE OF ULCERS (sore throat, sores in mouth, or a toothache) UNUSUAL RASH, SWELLING OR PAIN  UNUSUAL VAGINAL DISCHARGE OR ITCHING   Items with * indicate a potential emergency and should be followed up as soon as possible or go to the Emergency Department if any problems should occur.  Please show the CHEMOTHERAPY ALERT CARD or IMMUNOTHERAPY ALERT CARD at check-in to  the Emergency Department and triage nurse.  Should you have questions after your visit or need to cancel or reschedule your appointment, please contact [facility]  Dept: [phone]  and follow the prompts.  Office hours are 8:00 a.m. to 4:30 p.m. Monday - Friday. Please note that voicemails left after 4:00 p.m. may not be returned until the following business day.  We are closed weekends and major holidays. You have access to a nurse at all times for urgent questions. Please call the main number to the clinic Dept: [phone] and follow the prompts.  For any non-urgent questions, you may also contact your provider using MyChart. We now offer e-Visits for anyone 97 and older to request care online for non-urgent symptoms. For details visit [email or web address].   Also download the MyChart app! Go to the app store, search "MyChart", open the app, select [facility], and log in with your MyChart username and password.  Due to Covid, a mask is required upon entering the hospital/clinic. If you do not have a mask, one will be given to you upon arrival. For doctor visits, patients may have 1 support person aged 76 or older with them. For treatment visits, patients cannot have anyone with them due to current Covid guidelines and our immunocompromised population.   Pertuzumab injection What is this medication? PERTUZUMAB (per TOOZ ue mab) is a monoclonal antibody. It is used to treatbreast cancer. This medicine may be used for other purposes; ask your health care provider orpharmacist if you have questions. COMMON BRAND NAME(S): PERJETA What should I tell my care team before I take  this medication? They need to know if you have any of these conditions: heart disease heart failure high blood pressure history of irregular heart beat recent or ongoing radiation therapy an unusual or allergic reaction to pertuzumab, other medicines, foods, dyes, or preservatives pregnant  or trying to get pregnant breast-feeding How should I use this medication? This medicine is for infusion into a vein. It is given by a health careprofessional in a hospital or clinic setting. Talk to your pediatrician regarding the use of this medicine in children.Special care may be needed. Overdosage: If you think you have taken too much of this medicine contact apoison control center or emergency room at once. NOTE: This medicine is only for you. Do not share this medicine with others. What if I miss a dose? It is important not to miss your dose. Call your doctor or health careprofessional if you are unable to keep an appointment. What may interact with this medication? Interactions are not expected. Give your health care provider a list of all the medicines, herbs, non-prescription drugs, or dietary supplements you use. Also tell them if you smoke, drink alcohol, or use illegal drugs. Some items may interact with yourmedicine. This list may not describe all possible interactions. Give your health care provider a list of all the medicines, herbs, non-prescription drugs, or dietary supplements you use. Also tell them if you smoke, drink alcohol, or use illegaldrugs. Some items may interact with your medicine. What should I watch for while using this medication? Your condition will be monitored carefully while you are receiving this medicine. Report any side effects. Continue your course of treatment eventhough you feel ill unless your doctor tells you to stop. Do not become pregnant while taking this medicine or for 7 months after stopping it. Women should inform their doctor if they wish to become pregnant or think they might be pregnant. Women of child-bearing potential will need to have a negative pregnancy test before starting this medicine. There is a potential for serious side effects to an unborn child. Talk to your health care professional or pharmacist for more information. Do not  breast-feed an infantwhile taking this medicine or for 7 months after stopping it. Women must use effective birth control with this medicine. Call your doctor or health care professional for advice if you get a fever, chills or sore throat, or other symptoms of a cold or flu. Do not treatyourself. Try to avoid being around people who are sick. You may experience fever, chills, and headache during the infusion. Report anyside effects during the infusion to your health care professional. What side effects may I notice from receiving this medication? Side effects that you should report to your doctor or health care professionalas soon as possible: breathing problems chest pain or palpitations dizziness feeling faint or lightheaded fever or chills skin rash, itching or hives sore throat swelling of the face, lips, or tongue swelling of the legs or ankles unusually weak or tired Side effects that usually do not require medical attention (report to yourdoctor or health care professional if they continue or are bothersome): diarrhea hair loss nausea, vomiting tiredness This list may not describe all possible side effects. Call your doctor for medical advice about side effects. You may report side effects to FDA at1-800-FDA-1088. Where should I keep my medication? This drug is given in a hospital or clinic and will not be stored at home. NOTE: This sheet is a summary. It may not cover all possible information. If you  have questions about this medicine, talk to your doctor, pharmacist, orhealth care provider.  2022 Elsevier/Gold Standard (2015-07-27 12:08:50) Trastuzumab injection for infusion What is this medication? TRASTUZUMAB (tras TOO zoo mab) is a monoclonal antibody. It is used to treatbreast cancer and stomach cancer. This medicine may be used for other purposes; ask your health care provider orpharmacist if you have questions. COMMON BRAND NAME(S): Herceptin, Janae Bridgeman,  Ontruzant, Trazimera What should I tell my care team before I take this medication? They need to know if you have any of these conditions: heart disease heart failure lung or breathing disease, like asthma an unusual or allergic reaction to trastuzumab, benzyl alcohol, or other medications, foods, dyes, or preservatives pregnant or trying to get pregnant breast-feeding How should I use this medication? This drug is given as an infusion into a vein. It is administered in a hospitalor clinic by a specially trained health care professional. Talk to your pediatrician regarding the use of this medicine in children. Thismedicine is not approved for use in children. Overdosage: If you think you have taken too much of this medicine contact apoison control center or emergency room at once. NOTE: This medicine is only for you. Do not share this medicine with others. What if I miss a dose? It is important not to miss a dose. Call your doctor or health careprofessional if you are unable to keep an appointment. What may interact with this medication? This medicine may interact with the following medications: certain types of chemotherapy, such as daunorubicin, doxorubicin, epirubicin, and idarubicin This list may not describe all possible interactions. Give your health care provider a list of all the medicines, herbs, non-prescription drugs, or dietary supplements you use. Also tell them if you smoke, drink alcohol, or use illegaldrugs. Some items may interact with your medicine. What should I watch for while using this medication? Visit your doctor for checks on your progress. Report any side effects. Continue your course of treatment even though you feel ill unless your doctortells you to stop. Call your doctor or health care professional for advice if you get a fever, chills or sore throat, or other symptoms of a cold or flu. Do not treatyourself. Try to avoid being around people who are sick. You may  experience fever, chills and shaking during your first infusion. These effects are usually mild and can be treated with other medicines. Report any side effects during the infusion to your health care professional. Fever andchills usually do not happen with later infusions. Do not become pregnant while taking this medicine or for 7 months after stopping it. Women should inform their doctor if they wish to become pregnant or think they might be pregnant. Women of child-bearing potential will need to have a negative pregnancy test before starting this medicine. There is a potential for serious side effects to an unborn child. Talk to your health care professional or pharmacist for more information. Do not breast-feed an infantwhile taking this medicine or for 7 months after stopping it. Women must use effective birth control with this medicine. What side effects may I notice from receiving this medication? Side effects that you should report to your doctor or health care professionalas soon as possible: allergic reactions like skin rash, itching or hives, swelling of the face, lips, or tongue chest pain or palpitations cough dizziness feeling faint or lightheaded, falls fever general ill feeling or flu-like symptoms signs of worsening heart failure like breathing problems; swelling in your legs and feet  unusually weak or tired Side effects that usually do not require medical attention (report to yourdoctor or health care professional if they continue or are bothersome): bone pain changes in taste diarrhea joint pain nausea/vomiting weight loss This list may not describe all possible side effects. Call your doctor for medical advice about side effects. You may report side effects to FDA at1-800-FDA-1088. Where should I keep my medication? This drug is given in a hospital or clinic and will not be stored at home. NOTE: This sheet is a summary. It may not cover all possible information. If you  have questions about this medicine, talk to your doctor, pharmacist, orhealth care provider.  2022 Elsevier/Gold Standard (2016-06-18 14:37:52)

## 2021-03-02 ENCOUNTER — Encounter: Payer: Self-pay | Admitting: Oncology

## 2021-03-09 NOTE — Progress Notes (Signed)
[facility]  [address] [place],  [place]  [address] [phone]  Clinic Day:  03/19/2021  Referring physician: Orrin Brigham, MD  This document serves as a record of services personally performed by Dequincy Macarthur Critchley, MD. It was created on their behalf by [facility], a trained medical scribe. The creation of this record is based on the scribe's personal observations and the provider's statements to them.  HISTORY OF PRESENT ILLNESS:  The patient is a 60 y.o. 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  She is taking adjuvant Perjeta/Herceptin, which is being given once every 3 weeks for a full year.  She presents today prior to her 4th cycle of adjuvant therapy. She tolerated cycle 3 without any significant difficulty.  She has already completed her adjuvant breast radiation, which she tolerated without any difficulties.  She denies having any particular changes in her breasts which concern her for disease recurrence.    PHYSICAL EXAM:  Blood pressure 108/60, pulse 72, temperature 98.9 F (37.2 C), resp. rate 14, weight 195 lb 1.6 oz (88.5 kg), last menstrual period 10/25/2015, SpO2 98 %. Wt Readings from Last 3 Encounters:  03/19/21 195 lb 1.6 oz (88.5 kg)  02/27/21 192 lb 8 oz (87.3 kg)  02/26/21 192 lb 6.4 oz (87.3 kg)   Body mass index is 33.49 kg/m. Performance status (ECOG): 0 - Asymptomatic Physical Exam Constitutional:      General: She is not in acute distress.    Appearance: Normal appearance. She is normal weight.  HENT:     Head: Normocephalic and atraumatic.  Eyes:     General: No scleral icterus.    Extraocular Movements: Extraocular movements intact.     Conjunctiva/sclera: Conjunctivae normal.     Pupils: Pupils are equal, round, and reactive to light.  Cardiovascular:     Rate and Rhythm: Normal rate and regular rhythm.     Pulses: Normal pulses.     Heart sounds: Normal heart sounds. No murmur heard.    No friction rub. No gallop.  Pulmonary:     Effort: Pulmonary effort is normal. No respiratory distress.     Breath sounds: Normal breath sounds.  Abdominal:     General: Bowel sounds are normal. There is no distension.     Palpations: Abdomen is soft. There is no hepatomegaly, splenomegaly or mass.     Tenderness: There is no abdominal tenderness.  Musculoskeletal:        General: Normal range of motion.     Cervical back: Normal range of motion and neck supple.     Right lower leg: No edema.     Left lower leg: No edema.  Lymphadenopathy:     Cervical: No cervical adenopathy.  Skin:    General: Skin is warm and dry.  Neurological:     General: No focal deficit present.     Mental Status: She is alert and oriented to person, place, and time. Mental status is at baseline.  Psychiatric:        Mood and Affect: Mood normal.        Behavior: Behavior normal.        Thought Content: Thought content normal.        Judgment: Judgment normal.   LABS:  Ref. Range 03/19/2021 00:00  Sodium Latest Ref Range: 137 - 147  140  Potassium Latest Ref Range: 3.4 - 5.3  3.4  Chloride Latest Ref Range: 99 -  108  106  CO2 Latest Ref Range: 13 - 22  26 (A)  Glucose Unknown 92  BUN Latest Ref Range: 4 - 21  12  Creatinine Latest Ref Range: 0.5 - 1.1  0.8  Calcium Latest Ref Range: 8.7 - 10.7  8.5 (A)  Alkaline Phosphatase Latest Ref Range: 25 - 125  73  Albumin Latest Ref Range: 3.5 - 5.0  4.1  AST Latest Ref Range: 13 - 35  33  ALT Latest Ref Range: 7 - 35  27  Bilirubin, Total Unknown 0.8  WBC Unknown 4.2  RBC Latest Ref Range: 3.87 - 5.11  3.82 (A)  Hemoglobin Latest Ref Range: 12.0 - 16.0  11.4 (A)  HCT Latest Ref Range: 36 - 46  34 (A)  MCV Latest Ref Range: 81 - 99  89  Platelets Latest Ref Range: 150 - 399  159  NEUT# Unknown 1.81   ASSESSMENT & PLAN:  A 60 y.o. 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   She will proceed with cycle 4 of Herceptin/Perjeta tomorrow. Clinically, she is doing well.  I will see her back in 6 weeks before she heads into her 6th cycle of adjuvant Herceptin/Perjeta, which is being given every 3 weeks.  The patient understands all the plans discussed today and is in agreement with them.    I, Rita Ohara, am acting as scribe for Marice Potter, MD    I have reviewed this report as typed by the medical scribe, and it is complete and accurate.  Dequincy Macarthur Critchley, MD

## 2021-03-19 ENCOUNTER — Inpatient Hospital Stay (INDEPENDENT_AMBULATORY_CARE_PROVIDER_SITE_OTHER): Payer: Managed Care, Other (non HMO) | Admitting: Oncology

## 2021-03-19 ENCOUNTER — Other Ambulatory Visit: Payer: Self-pay | Admitting: Hematology and Oncology

## 2021-03-19 ENCOUNTER — Encounter: Payer: Self-pay | Admitting: Oncology

## 2021-03-19 ENCOUNTER — Telehealth: Payer: Self-pay | Admitting: Oncology

## 2021-03-19 ENCOUNTER — Inpatient Hospital Stay: Payer: Managed Care, Other (non HMO) | Attending: Oncology

## 2021-03-19 VITALS — BP 108/60 | HR 72 | Temp 98.9°F | Resp 14 | Wt 195.1 lb

## 2021-03-19 DIAGNOSIS — Z171 Estrogen receptor negative status [ER-]: Secondary | ICD-10-CM

## 2021-03-19 DIAGNOSIS — C50411 Malignant neoplasm of upper-outer quadrant of right female breast: Secondary | ICD-10-CM

## 2021-03-19 DIAGNOSIS — Z86711 Personal history of pulmonary embolism: Secondary | ICD-10-CM | POA: Insufficient documentation

## 2021-03-19 DIAGNOSIS — Z86718 Personal history of other venous thrombosis and embolism: Secondary | ICD-10-CM | POA: Insufficient documentation

## 2021-03-19 DIAGNOSIS — Z170 Estrogen receptor positive status [ER+]: Secondary | ICD-10-CM | POA: Insufficient documentation

## 2021-03-19 DIAGNOSIS — Z5112 Encounter for antineoplastic immunotherapy: Secondary | ICD-10-CM | POA: Insufficient documentation

## 2021-03-19 DIAGNOSIS — Z79899 Other long term (current) drug therapy: Secondary | ICD-10-CM | POA: Insufficient documentation

## 2021-03-19 DIAGNOSIS — Z923 Personal history of irradiation: Secondary | ICD-10-CM | POA: Insufficient documentation

## 2021-03-19 DIAGNOSIS — C50911 Malignant neoplasm of unspecified site of right female breast: Secondary | ICD-10-CM | POA: Insufficient documentation

## 2021-03-19 DIAGNOSIS — C50419 Malignant neoplasm of upper-outer quadrant of unspecified female breast: Secondary | ICD-10-CM

## 2021-03-19 DIAGNOSIS — Z7901 Long term (current) use of anticoagulants: Secondary | ICD-10-CM | POA: Insufficient documentation

## 2021-03-19 LAB — HEPATIC FUNCTION PANEL
ALT: 27 (ref 7–35)
AST: 33 (ref 13–35)
Alkaline Phosphatase: 73 (ref 25–125)
Bilirubin, Total: 0.8

## 2021-03-19 LAB — BASIC METABOLIC PANEL
BUN: 12 (ref 4–21)
CO2: 26 — AB (ref 13–22)
Chloride: 106 (ref 99–108)
Creatinine: 0.8 (ref 0.5–1.1)
Glucose: 92
Potassium: 3.4 (ref 3.4–5.3)
Sodium: 140 (ref 137–147)

## 2021-03-19 LAB — CBC
MCV: 89 (ref 81–99)
RBC: 3.82 — AB (ref 3.87–5.11)

## 2021-03-19 LAB — CBC AND DIFFERENTIAL
HCT: 34 — AB (ref 36–46)
Hemoglobin: 11.4 — AB (ref 12.0–16.0)
Neutrophils Absolute: 1.81
Platelets: 159 (ref 150–399)
WBC: 4.2

## 2021-03-19 LAB — COMPREHENSIVE METABOLIC PANEL
Albumin: 4.1 (ref 3.5–5.0)
Calcium: 8.5 — AB (ref 8.7–10.7)

## 2021-03-19 MED FILL — Trastuzumab-anns For IV Soln 150 MG: INTRAVENOUS | Qty: 25 | Status: AC

## 2021-03-19 MED FILL — Pertuzumab Soln for IV Infusion 420 MG/14ML (30 MG/ML): INTRAVENOUS | Qty: 14 | Status: AC

## 2021-03-19 NOTE — Telephone Encounter (Signed)
Per 9/12 LOS, patient scheduled for Oct Appt's including Infusion.  Patient will be scheduled for an ECHO before Oct Appt after receiving Prior Auth

## 2021-03-20 ENCOUNTER — Other Ambulatory Visit: Payer: Self-pay

## 2021-03-20 ENCOUNTER — Inpatient Hospital Stay: Payer: Managed Care, Other (non HMO)

## 2021-03-20 VITALS — BP 116/62 | HR 72 | Temp 98.5°F | Resp 18 | Ht 64.0 in | Wt 194.8 lb

## 2021-03-20 DIAGNOSIS — Z5112 Encounter for antineoplastic immunotherapy: Secondary | ICD-10-CM | POA: Diagnosis not present

## 2021-03-20 DIAGNOSIS — Z79899 Other long term (current) drug therapy: Secondary | ICD-10-CM | POA: Diagnosis not present

## 2021-03-20 DIAGNOSIS — Z923 Personal history of irradiation: Secondary | ICD-10-CM | POA: Diagnosis not present

## 2021-03-20 DIAGNOSIS — Z171 Estrogen receptor negative status [ER-]: Secondary | ICD-10-CM

## 2021-03-20 DIAGNOSIS — Z86711 Personal history of pulmonary embolism: Secondary | ICD-10-CM | POA: Diagnosis not present

## 2021-03-20 DIAGNOSIS — Z86718 Personal history of other venous thrombosis and embolism: Secondary | ICD-10-CM | POA: Diagnosis not present

## 2021-03-20 DIAGNOSIS — Z7901 Long term (current) use of anticoagulants: Secondary | ICD-10-CM | POA: Diagnosis not present

## 2021-03-20 DIAGNOSIS — C50911 Malignant neoplasm of unspecified site of right female breast: Secondary | ICD-10-CM | POA: Diagnosis present

## 2021-03-20 DIAGNOSIS — Z170 Estrogen receptor positive status [ER+]: Secondary | ICD-10-CM | POA: Diagnosis not present

## 2021-03-20 MED ORDER — SODIUM CHLORIDE 0.9 % IV SOLN: Freq: Once | INTRAVENOUS | Status: AC

## 2021-03-20 MED ORDER — HEPARIN SOD (PORK) LOCK FLUSH 100 UNIT/ML IV SOLN
500.0000 [IU] | Freq: Once | INTRAVENOUS | Status: AC | PRN
  Administered 2021-03-20: 500 [IU]

## 2021-03-20 MED ORDER — TRASTUZUMAB-ANNS CHEMO 150 MG IV SOLR
6.0000 mg/kg | Freq: Once | INTRAVENOUS | Status: AC
  Administered 2021-03-20: 525 mg via INTRAVENOUS
  Filled 2021-03-20: qty 25

## 2021-03-20 MED ORDER — SODIUM CHLORIDE 0.9% FLUSH
10.0000 mL | INTRAVENOUS | Status: DC | PRN
  Administered 2021-03-20: 10 mL

## 2021-03-20 MED ORDER — DIPHENHYDRAMINE HCL 25 MG PO CAPS: 50.0000 mg | ORAL_CAPSULE | Freq: Once | ORAL | Status: DC

## 2021-03-20 MED ORDER — SODIUM CHLORIDE 0.9 % IV SOLN
420.0000 mg | Freq: Once | INTRAVENOUS | Status: AC
  Administered 2021-03-20: 420 mg via INTRAVENOUS
  Filled 2021-03-20: qty 14

## 2021-03-20 MED ORDER — ACETAMINOPHEN 325 MG PO TABS: 650.0000 mg | ORAL_TABLET | Freq: Once | ORAL | Status: DC

## 2021-03-20 NOTE — Patient Instructions (Signed)
Pertuzumab injection What is this medication? PERTUZUMAB (per TOOZ ue mab) is a monoclonal antibody. It is used to treat breast cancer. This medicine may be used for other purposes; ask your health care provider or pharmacist if you have questions. COMMON BRAND NAME(S): PERJETA What should I tell my care team before I take this medication? They need to know if you have any of these conditions: heart disease heart failure high blood pressure history of irregular heart beat recent or ongoing radiation therapy an unusual or allergic reaction to pertuzumab, other medicines, foods, dyes, or preservatives pregnant or trying to get pregnant breast-feeding How should I use this medication? This medicine is for infusion into a vein. It is given by a health care professional in a hospital or clinic setting. Talk to your pediatrician regarding the use of this medicine in children. Special care may be needed. Overdosage: If you think you have taken too much of this medicine contact a poison control center or emergency room at once. NOTE: This medicine is only for you. Do not share this medicine with others. What if I miss a dose? It is important not to miss your dose. Call your doctor or health care professional if you are unable to keep an appointment. What may interact with this medication? Interactions are not expected. Give your health care provider a list of all the medicines, herbs, non-prescription drugs, or dietary supplements you use. Also tell them if you smoke, drink alcohol, or use illegal drugs. Some items may interact with your medicine. This list may not describe all possible interactions. Give your health care provider a list of all the medicines, herbs, non-prescription drugs, or dietary supplements you use. Also tell them if you smoke, drink alcohol, or use illegal drugs. Some items may interact with your medicine. What should I watch for while using this medication? Your condition  will be monitored carefully while you are receiving this medicine. Report any side effects. Continue your course of treatment even though you feel ill unless your doctor tells you to stop. Do not become pregnant while taking this medicine or for 7 months after stopping it. Women should inform their doctor if they wish to become pregnant or think they might be pregnant. Women of child-bearing potential will need to have a negative pregnancy test before starting this medicine. There is a potential for serious side effects to an unborn child. Talk to your health care professional or pharmacist for more information. Do not breast-feed an infant while taking this medicine or for 7 months after stopping it. Women must use effective birth control with this medicine. Call your doctor or health care professional for advice if you get a fever, chills or sore throat, or other symptoms of a cold or flu. Do not treat yourself. Try to avoid being around people who are sick. You may experience fever, chills, and headache during the infusion. Report any side effects during the infusion to your health care professional. What side effects may I notice from receiving this medication? Side effects that you should report to your doctor or health care professional as soon as possible: breathing problems chest pain or palpitations dizziness feeling faint or lightheaded fever or chills skin rash, itching or hives sore throat swelling of the face, lips, or tongue swelling of the legs or ankles unusually weak or tired Side effects that usually do not require medical attention (report to your doctor or health care professional if they continue or are bothersome): diarrhea hair loss   nausea, vomiting tiredness This list may not describe all possible side effects. Call your doctor for medical advice about side effects. You may report side effects to FDA at 1-800-FDA-1088. Where should I keep my medication? This drug is  given in a hospital or clinic and will not be stored at home. NOTE: This sheet is a summary. It may not cover all possible information. If you have questions about this medicine, talk to your doctor, pharmacist, or health care provider.  2022 Elsevier/Gold Standard (2015-07-27 12:08:50) Trastuzumab injection for infusion What is this medication? TRASTUZUMAB (tras TOO zoo mab) is a monoclonal antibody. It is used to treat breast cancer and stomach cancer. This medicine may be used for other purposes; ask your health care provider or pharmacist if you have questions. COMMON BRAND NAME(S): Herceptin, Janae Bridgeman, Ontruzant, Trazimera What should I tell my care team before I take this medication? They need to know if you have any of these conditions: heart disease heart failure lung or breathing disease, like asthma an unusual or allergic reaction to trastuzumab, benzyl alcohol, or other medications, foods, dyes, or preservatives pregnant or trying to get pregnant breast-feeding How should I use this medication? This drug is given as an infusion into a vein. It is administered in a hospital or clinic by a specially trained health care professional. Talk to your pediatrician regarding the use of this medicine in children. This medicine is not approved for use in children. Overdosage: If you think you have taken too much of this medicine contact a poison control center or emergency room at once. NOTE: This medicine is only for you. Do not share this medicine with others. What if I miss a dose? It is important not to miss a dose. Call your doctor or health care professional if you are unable to keep an appointment. What may interact with this medication? This medicine may interact with the following medications: certain types of chemotherapy, such as daunorubicin, doxorubicin, epirubicin, and idarubicin This list may not describe all possible interactions. Give your health care  provider a list of all the medicines, herbs, non-prescription drugs, or dietary supplements you use. Also tell them if you smoke, drink alcohol, or use illegal drugs. Some items may interact with your medicine. What should I watch for while using this medication? Visit your doctor for checks on your progress. Report any side effects. Continue your course of treatment even though you feel ill unless your doctor tells you to stop. Call your doctor or health care professional for advice if you get a fever, chills or sore throat, or other symptoms of a cold or flu. Do not treat yourself. Try to avoid being around people who are sick. You may experience fever, chills and shaking during your first infusion. These effects are usually mild and can be treated with other medicines. Report any side effects during the infusion to your health care professional. Fever and chills usually do not happen with later infusions. Do not become pregnant while taking this medicine or for 7 months after stopping it. Women should inform their doctor if they wish to become pregnant or think they might be pregnant. Women of child-bearing potential will need to have a negative pregnancy test before starting this medicine. There is a potential for serious side effects to an unborn child. Talk to your health care professional or pharmacist for more information. Do not breast-feed an infant while taking this medicine or for 7 months after stopping it. Women must use  effective birth control with this medicine. What side effects may I notice from receiving this medication? Side effects that you should report to your doctor or health care professional as soon as possible: allergic reactions like skin rash, itching or hives, swelling of the face, lips, or tongue chest pain or palpitations cough dizziness feeling faint or lightheaded, falls fever general ill feeling or flu-like symptoms signs of worsening heart failure like breathing  problems; swelling in your legs and feet unusually weak or tired Side effects that usually do not require medical attention (report to your doctor or health care professional if they continue or are bothersome): bone pain changes in taste diarrhea joint pain nausea/vomiting weight loss This list may not describe all possible side effects. Call your doctor for medical advice about side effects. You may report side effects to FDA at 1-800-FDA-1088. Where should I keep my medication? This drug is given in a hospital or clinic and will not be stored at home. NOTE: This sheet is a summary. It may not cover all possible information. If you have questions about this medicine, talk to your doctor, pharmacist, or health care provider.  2022 Elsevier/Gold Standard (2016-06-18 14:37:52)

## 2021-03-23 ENCOUNTER — Encounter: Payer: Self-pay | Admitting: Oncology

## 2021-04-04 ENCOUNTER — Encounter: Payer: Self-pay | Admitting: Oncology

## 2021-04-05 DIAGNOSIS — C50419 Malignant neoplasm of upper-outer quadrant of unspecified female breast: Secondary | ICD-10-CM

## 2021-04-05 DIAGNOSIS — T451X1D Poisoning by antineoplastic and immunosuppressive drugs, accidental (unintentional), subsequent encounter: Secondary | ICD-10-CM | POA: Diagnosis not present

## 2021-04-09 ENCOUNTER — Inpatient Hospital Stay: Payer: Managed Care, Other (non HMO) | Attending: Oncology

## 2021-04-09 ENCOUNTER — Other Ambulatory Visit: Payer: Self-pay

## 2021-04-09 ENCOUNTER — Other Ambulatory Visit: Payer: Self-pay | Admitting: Hematology and Oncology

## 2021-04-09 DIAGNOSIS — C50419 Malignant neoplasm of upper-outer quadrant of unspecified female breast: Secondary | ICD-10-CM

## 2021-04-09 DIAGNOSIS — Z171 Estrogen receptor negative status [ER-]: Secondary | ICD-10-CM

## 2021-04-09 DIAGNOSIS — C50411 Malignant neoplasm of upper-outer quadrant of right female breast: Secondary | ICD-10-CM

## 2021-04-09 DIAGNOSIS — Z923 Personal history of irradiation: Secondary | ICD-10-CM | POA: Insufficient documentation

## 2021-04-09 DIAGNOSIS — C50911 Malignant neoplasm of unspecified site of right female breast: Secondary | ICD-10-CM | POA: Insufficient documentation

## 2021-04-09 DIAGNOSIS — Z5112 Encounter for antineoplastic immunotherapy: Secondary | ICD-10-CM | POA: Insufficient documentation

## 2021-04-09 LAB — CBC AND DIFFERENTIAL
HCT: 40 (ref 36–46)
Hemoglobin: 13.1 (ref 12.0–16.0)
Neutrophils Absolute: 1.79
Platelets: 175 (ref 150–399)
WBC: 3.9

## 2021-04-09 LAB — MAGNESIUM: Magnesium: 2.2

## 2021-04-09 LAB — CBC
MCV: 90 (ref 81–99)
RBC: 4.48 (ref 3.87–5.11)

## 2021-04-09 MED FILL — Trastuzumab-anns For IV Soln 150 MG: INTRAVENOUS | Qty: 25 | Status: AC

## 2021-04-09 MED FILL — Pertuzumab Soln for IV Infusion 420 MG/14ML (30 MG/ML): INTRAVENOUS | Qty: 14 | Status: AC

## 2021-04-10 ENCOUNTER — Other Ambulatory Visit: Payer: Self-pay

## 2021-04-10 ENCOUNTER — Inpatient Hospital Stay: Payer: Managed Care, Other (non HMO)

## 2021-04-10 VITALS — BP 112/73 | HR 74 | Temp 98.0°F | Resp 18 | Ht 64.0 in | Wt 192.1 lb

## 2021-04-10 DIAGNOSIS — C50911 Malignant neoplasm of unspecified site of right female breast: Secondary | ICD-10-CM | POA: Diagnosis present

## 2021-04-10 DIAGNOSIS — Z171 Estrogen receptor negative status [ER-]: Secondary | ICD-10-CM

## 2021-04-10 DIAGNOSIS — C50419 Malignant neoplasm of upper-outer quadrant of unspecified female breast: Secondary | ICD-10-CM

## 2021-04-10 DIAGNOSIS — Z923 Personal history of irradiation: Secondary | ICD-10-CM | POA: Diagnosis not present

## 2021-04-10 DIAGNOSIS — Z5112 Encounter for antineoplastic immunotherapy: Secondary | ICD-10-CM | POA: Diagnosis not present

## 2021-04-10 MED ORDER — SODIUM CHLORIDE 0.9% FLUSH
10.0000 mL | INTRAVENOUS | Status: DC | PRN
Start: 2021-04-10 — End: 2021-04-10
  Administered 2021-04-10: 10 mL

## 2021-04-10 MED ORDER — DIPHENHYDRAMINE HCL 25 MG PO CAPS: 50.0000 mg | ORAL_CAPSULE | Freq: Once | ORAL | Status: DC

## 2021-04-10 MED ORDER — SODIUM CHLORIDE 0.9 % IV SOLN
420.0000 mg | Freq: Once | INTRAVENOUS | Status: AC
  Administered 2021-04-10: 420 mg via INTRAVENOUS
  Filled 2021-04-10: qty 14

## 2021-04-10 MED ORDER — HEPARIN SOD (PORK) LOCK FLUSH 100 UNIT/ML IV SOLN
500.0000 [IU] | Freq: Once | INTRAVENOUS | Status: AC | PRN
  Administered 2021-04-10: 500 [IU]

## 2021-04-10 MED ORDER — SODIUM CHLORIDE 0.9 % IV SOLN: Freq: Once | INTRAVENOUS | Status: AC

## 2021-04-10 MED ORDER — TRASTUZUMAB-ANNS CHEMO 150 MG IV SOLR
6.0000 mg/kg | Freq: Once | INTRAVENOUS | Status: AC
  Administered 2021-04-10: 525 mg via INTRAVENOUS
  Filled 2021-04-10: qty 25

## 2021-04-10 MED ORDER — ACETAMINOPHEN 325 MG PO TABS: 650.0000 mg | ORAL_TABLET | Freq: Once | ORAL | Status: DC

## 2021-04-10 NOTE — Patient Instructions (Signed)
[facility]  Discharge Instructions: Thank you for choosing [facility] to provide your oncology and hematology care.  If you have a lab appointment with the [facility], please go directly to the [facility] and check in at the registration area.   Wear comfortable clothing and clothing appropriate for easy access to any Portacath or PICC line.   We strive to give you quality time with your provider. You may need to reschedule your appointment if you arrive late (15 or more minutes).  Arriving late affects you and other patients whose appointments are after yours.  Also, if you miss three or more appointments without notifying the office, you may be dismissed from the clinic at the provider's discretion.      For prescription refill requests, have your pharmacy contact our office and allow 72 hours for refills to be completed.    Today you received the following chemotherapy and/or immunotherapy agents trastuzumab/pertuzumab   To help prevent nausea and vomiting after your treatment, we encourage you to take your nausea medication as directed.  BELOW ARE SYMPTOMS THAT SHOULD BE REPORTED IMMEDIATELY: *FEVER GREATER THAN 100.4 F (38 C) OR HIGHER *CHILLS OR SWEATING *NAUSEA AND VOMITING THAT IS NOT CONTROLLED WITH YOUR NAUSEA MEDICATION *UNUSUAL SHORTNESS OF BREATH *UNUSUAL BRUISING OR BLEEDING *URINARY PROBLEMS (pain or burning when urinating, or frequent urination) *BOWEL PROBLEMS (unusual diarrhea, constipation, pain near the anus) TENDERNESS IN MOUTH AND THROAT WITH OR WITHOUT PRESENCE OF ULCERS (sore throat, sores in mouth, or a toothache) UNUSUAL RASH, SWELLING OR PAIN  UNUSUAL VAGINAL DISCHARGE OR ITCHING   Items with * indicate a potential emergency and should be followed up as soon as possible or go to the Emergency Department if any problems should occur.  Please show the CHEMOTHERAPY ALERT CARD or IMMUNOTHERAPY ALERT CARD at check-in  to the Emergency Department and triage nurse.  Should you have questions after your visit or need to cancel or reschedule your appointment, please contact [facility]  Dept: [phone]  and follow the prompts.  Office hours are 8:00 a.m. to 4:30 p.m. Monday - Friday. Please note that voicemails left after 4:00 p.m. may not be returned until the following business day.  We are closed weekends and major holidays. You have access to a nurse at all times for urgent questions. Please call the main number to the clinic Dept: [phone] and follow the prompts.  For any non-urgent questions, you may also contact your provider using MyChart. We now offer e-Visits for anyone 50 and older to request care online for non-urgent symptoms. For details visit [email or web address].   Also download the MyChart app! Go to the app store, search "MyChart", open the app, select [facility], and log in with your MyChart username and password.  Due to Covid, a mask is required upon entering the hospital/clinic. If you do not have a mask, one will be given to you upon arrival. For doctor visits, patients may have 1 support person aged 76 or older with them. For treatment visits, patients cannot have anyone with them due to current Covid guidelines and our immunocompromised population.   Pertuzumab injection What is this medication? PERTUZUMAB (per TOOZ ue mab) is a monoclonal antibody. It is used to treat breast cancer. This medicine may be used for other purposes; ask your health care provider or pharmacist if you have questions. COMMON BRAND NAME(S): PERJETA What should I tell my care team before I  take this medication? They need to know if you have any of these conditions: heart disease heart failure high blood pressure history of irregular heart beat recent or ongoing radiation therapy an unusual or allergic reaction to pertuzumab, other medicines, foods, dyes, or  preservatives pregnant or trying to get pregnant breast-feeding How should I use this medication? This medicine is for infusion into a vein. It is given by a health care professional in a hospital or clinic setting. Talk to your pediatrician regarding the use of this medicine in children. Special care may be needed. Overdosage: If you think you have taken too much of this medicine contact a poison control center or emergency room at once. NOTE: This medicine is only for you. Do not share this medicine with others. What if I miss a dose? It is important not to miss your dose. Call your doctor or health care professional if you are unable to keep an appointment. What may interact with this medication? Interactions are not expected. Give your health care provider a list of all the medicines, herbs, non-prescription drugs, or dietary supplements you use. Also tell them if you smoke, drink alcohol, or use illegal drugs. Some items may interact with your medicine. This list may not describe all possible interactions. Give your health care provider a list of all the medicines, herbs, non-prescription drugs, or dietary supplements you use. Also tell them if you smoke, drink alcohol, or use illegal drugs. Some items may interact with your medicine. What should I watch for while using this medication? Your condition will be monitored carefully while you are receiving this medicine. Report any side effects. Continue your course of treatment even though you feel ill unless your doctor tells you to stop. Do not become pregnant while taking this medicine or for 7 months after stopping it. Women should inform their doctor if they wish to become pregnant or think they might be pregnant. Women of child-bearing potential will need to have a negative pregnancy test before starting this medicine. There is a potential for serious side effects to an unborn child. Talk to your health care professional or pharmacist for  more information. Do not breast-feed an infant while taking this medicine or for 7 months after stopping it. Women must use effective birth control with this medicine. Call your doctor or health care professional for advice if you get a fever, chills or sore throat, or other symptoms of a cold or flu. Do not treat yourself. Try to avoid being around people who are sick. You may experience fever, chills, and headache during the infusion. Report any side effects during the infusion to your health care professional. What side effects may I notice from receiving this medication? Side effects that you should report to your doctor or health care professional as soon as possible: breathing problems chest pain or palpitations dizziness feeling faint or lightheaded fever or chills skin rash, itching or hives sore throat swelling of the face, lips, or tongue swelling of the legs or ankles unusually weak or tired Side effects that usually do not require medical attention (report to your doctor or health care professional if they continue or are bothersome): diarrhea hair loss nausea, vomiting tiredness This list may not describe all possible side effects. Call your doctor for medical advice about side effects. You may report side effects to FDA at 1-800-FDA-1088. Where should I keep my medication? This drug is given in a hospital or clinic and will not be stored at home. NOTE:  This sheet is a summary. It may not cover all possible information. If you have questions about this medicine, talk to your doctor, pharmacist, or health care provider.  2022 Elsevier/Gold Standard (2015-07-27 12:08:50) Trastuzumab injection for infusion What is this medication? TRASTUZUMAB (tras TOO zoo mab) is a monoclonal antibody. It is used to treat breast cancer and stomach cancer. This medicine may be used for other purposes; ask your health care provider or pharmacist if you have questions. COMMON BRAND NAME(S):  Herceptin, Janae Bridgeman, Ontruzant, Trazimera What should I tell my care team before I take this medication? They need to know if you have any of these conditions: heart disease heart failure lung or breathing disease, like asthma an unusual or allergic reaction to trastuzumab, benzyl alcohol, or other medications, foods, dyes, or preservatives pregnant or trying to get pregnant breast-feeding How should I use this medication? This drug is given as an infusion into a vein. It is administered in a hospital or clinic by a specially trained health care professional. Talk to your pediatrician regarding the use of this medicine in children. This medicine is not approved for use in children. Overdosage: If you think you have taken too much of this medicine contact a poison control center or emergency room at once. NOTE: This medicine is only for you. Do not share this medicine with others. What if I miss a dose? It is important not to miss a dose. Call your doctor or health care professional if you are unable to keep an appointment. What may interact with this medication? This medicine may interact with the following medications: certain types of chemotherapy, such as daunorubicin, doxorubicin, epirubicin, and idarubicin This list may not describe all possible interactions. Give your health care provider a list of all the medicines, herbs, non-prescription drugs, or dietary supplements you use. Also tell them if you smoke, drink alcohol, or use illegal drugs. Some items may interact with your medicine. What should I watch for while using this medication? Visit your doctor for checks on your progress. Report any side effects. Continue your course of treatment even though you feel ill unless your doctor tells you to stop. Call your doctor or health care professional for advice if you get a fever, chills or sore throat, or other symptoms of a cold or flu. Do not treat yourself. Try to avoid  being around people who are sick. You may experience fever, chills and shaking during your first infusion. These effects are usually mild and can be treated with other medicines. Report any side effects during the infusion to your health care professional. Fever and chills usually do not happen with later infusions. Do not become pregnant while taking this medicine or for 7 months after stopping it. Women should inform their doctor if they wish to become pregnant or think they might be pregnant. Women of child-bearing potential will need to have a negative pregnancy test before starting this medicine. There is a potential for serious side effects to an unborn child. Talk to your health care professional or pharmacist for more information. Do not breast-feed an infant while taking this medicine or for 7 months after stopping it. Women must use effective birth control with this medicine. What side effects may I notice from receiving this medication? Side effects that you should report to your doctor or health care professional as soon as possible: allergic reactions like skin rash, itching or hives, swelling of the face, lips, or tongue chest pain or palpitations cough  dizziness feeling faint or lightheaded, falls fever general ill feeling or flu-like symptoms signs of worsening heart failure like breathing problems; swelling in your legs and feet unusually weak or tired Side effects that usually do not require medical attention (report to your doctor or health care professional if they continue or are bothersome): bone pain changes in taste diarrhea joint pain nausea/vomiting weight loss This list may not describe all possible side effects. Call your doctor for medical advice about side effects. You may report side effects to FDA at 1-800-FDA-1088. Where should I keep my medication? This drug is given in a hospital or clinic and will not be stored at home. NOTE: This sheet is a summary. It  may not cover all possible information. If you have questions about this medicine, talk to your doctor, pharmacist, or health care provider.  2022 Elsevier/Gold Standard (2016-06-18 14:37:52)

## 2021-04-24 NOTE — Progress Notes (Signed)
[facility]  [address] [employer],  [place]  [address] [phone]  Clinic Day:  04/30/2021  Referring physician: Orrin Brigham, MD  This document serves as a record of services personally performed by Dequincy Macarthur Critchley, MD. It was created on their behalf by [facility], a trained medical scribe. The creation of this record is based on the scribe's personal observations and the provider's statements to them.  HISTORY OF PRESENT ILLNESS:  The patient is a 60 y.o. 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  She is taking adjuvant Perjeta/Herceptin, which is being given once every 3 weeks for a full year.  She presents today prior to her 6th cycle of adjuvant therapy. She tolerated cycle 5 without any significant difficulty.  She does have intermittent diarrhea from the Perjeta component of her therapy, which had been managed with imodium.  She has already completed her adjuvant breast radiation, which she tolerated without any difficulties.  She denies having any particular changes in her breasts which concern her for early disease recurrence.    PHYSICAL EXAM:  Blood pressure 115/68, pulse 70, temperature 98.6 F (37 C), temperature source Oral, resp. rate 16, height 5' 4"  (1.626 m), weight 190 lb 12.8 oz (86.5 kg), last menstrual period 10/25/2015, SpO2 98 %. Wt Readings from Last 3 Encounters:  04/30/21 190 lb 12.8 oz (86.5 kg)  04/10/21 192 lb 1.3 oz (87.1 kg)  03/20/21 194 lb 12 oz (88.3 kg)   Body mass index is 32.75 kg/m. Performance status (ECOG): 0 - Asymptomatic Physical Exam Constitutional:      General: She is not in acute distress.    Appearance: Normal appearance. She is normal weight.  HENT:     Head: Normocephalic and atraumatic.  Eyes:     General: No scleral icterus.    Extraocular Movements: Extraocular movements intact.     Conjunctiva/sclera: Conjunctivae normal.     Pupils: Pupils are equal, round, and  reactive to light.  Cardiovascular:     Rate and Rhythm: Normal rate and regular rhythm.     Pulses: Normal pulses.     Heart sounds: Normal heart sounds. No murmur heard.   No friction rub. No gallop.  Pulmonary:     Effort: Pulmonary effort is normal. No respiratory distress.     Breath sounds: Normal breath sounds.  Abdominal:     General: Bowel sounds are normal. There is no distension.     Palpations: Abdomen is soft. There is no hepatomegaly, splenomegaly or mass.     Tenderness: There is no abdominal tenderness.  Musculoskeletal:        General: Normal range of motion.     Cervical back: Normal range of motion and neck supple.     Right lower leg: No edema.     Left lower leg: No edema.  Lymphadenopathy:     Cervical: No cervical adenopathy.  Skin:    General: Skin is warm and dry.  Neurological:     General: No focal deficit present.     Mental Status: She is alert and oriented to person, place, and time. Mental status is at baseline.  Psychiatric:        Mood and Affect: Mood normal.        Behavior: Behavior normal.        Thought Content: Thought content normal.        Judgment: Judgment normal.   LABS:  Ref. Range 03/19/2021  00:00  Sodium Latest Ref Range: 137 - 147  140  Potassium Latest Ref Range: 3.4 - 5.3  3.4  Chloride Latest Ref Range: 99 - 108  106  CO2 Latest Ref Range: 13 - 22  26 (A)  Glucose Unknown 92  BUN Latest Ref Range: 4 - 21  12  Creatinine Latest Ref Range: 0.5 - 1.1  0.8  Calcium Latest Ref Range: 8.7 - 10.7  8.5 (A)  Alkaline Phosphatase Latest Ref Range: 25 - 125  73  Albumin Latest Ref Range: 3.5 - 5.0  4.1  AST Latest Ref Range: 13 - 35  33  ALT Latest Ref Range: 7 - 35  27  Bilirubin, Total Unknown 0.8  WBC Unknown 4.2  RBC Latest Ref Range: 3.87 - 5.11  3.82 (A)  Hemoglobin Latest Ref Range: 12.0 - 16.0  11.4 (A)  HCT Latest Ref Range: 36 - 46  34 (A)  MCV Latest Ref Range: 81 - 99  89  Platelets Latest Ref Range: 150 - 399  159   NEUT# Unknown 1.81   ASSESSMENT & PLAN:  A 60 y.o. 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  She will proceed with cycle 6 of Herceptin/Perjeta tomorrow. Clinically, she is doing well.  I will see her back in 6 weeks before she heads into her 8th cycle of adjuvant Herceptin/Perjeta, which is being given every 3 weeks.  The patient understands all the plans discussed today and is in agreement with them.    I, Rita Ohara, am acting as scribe for Marice Potter, MD    I have reviewed this report as typed by the medical scribe, and it is complete and accurate.  Dequincy Macarthur Critchley, MD

## 2021-04-30 ENCOUNTER — Other Ambulatory Visit: Payer: Self-pay | Admitting: Oncology

## 2021-04-30 ENCOUNTER — Encounter: Payer: Self-pay | Admitting: Oncology

## 2021-04-30 ENCOUNTER — Inpatient Hospital Stay: Payer: Managed Care, Other (non HMO)

## 2021-04-30 ENCOUNTER — Inpatient Hospital Stay (INDEPENDENT_AMBULATORY_CARE_PROVIDER_SITE_OTHER): Payer: Managed Care, Other (non HMO) | Admitting: Oncology

## 2021-04-30 ENCOUNTER — Other Ambulatory Visit: Payer: Self-pay

## 2021-04-30 VITALS — BP 115/68 | HR 70 | Temp 98.6°F | Resp 16 | Ht 64.0 in | Wt 190.8 lb

## 2021-04-30 DIAGNOSIS — C50411 Malignant neoplasm of upper-outer quadrant of right female breast: Secondary | ICD-10-CM | POA: Diagnosis not present

## 2021-04-30 DIAGNOSIS — Z171 Estrogen receptor negative status [ER-]: Secondary | ICD-10-CM

## 2021-04-30 DIAGNOSIS — C50419 Malignant neoplasm of upper-outer quadrant of unspecified female breast: Secondary | ICD-10-CM

## 2021-04-30 LAB — BASIC METABOLIC PANEL
BUN: 11 (ref 4–21)
CO2: 25 — AB (ref 13–22)
Chloride: 109 — AB (ref 99–108)
Creatinine: 0.8 (ref 0.5–1.1)
Glucose: 100
Potassium: 3.9 (ref 3.4–5.3)
Sodium: 141 (ref 137–147)

## 2021-04-30 LAB — COMPREHENSIVE METABOLIC PANEL
Albumin: 3.9 (ref 3.5–5.0)
Calcium: 8.8 (ref 8.7–10.7)

## 2021-04-30 LAB — CBC AND DIFFERENTIAL
HCT: 40 (ref 36–46)
Hemoglobin: 13.3 (ref 12.0–16.0)
Neutrophils Absolute: 2.46
Platelets: 170 (ref 150–399)
WBC: 4.4

## 2021-04-30 LAB — HEPATIC FUNCTION PANEL
ALT: 18 (ref 7–35)
AST: 24 (ref 13–35)
Alkaline Phosphatase: 75 (ref 25–125)
Bilirubin, Total: 0.5

## 2021-04-30 LAB — CBC: RBC: 4.6 (ref 3.87–5.11)

## 2021-04-30 MED FILL — Trastuzumab-anns For IV Soln 150 MG: INTRAVENOUS | Qty: 25 | Status: AC

## 2021-04-30 MED FILL — Pertuzumab Soln for IV Infusion 420 MG/14ML (30 MG/ML): INTRAVENOUS | Qty: 14 | Status: AC

## 2021-04-30 NOTE — Progress Notes (Signed)
Face to face contact with pt in [place]. Patient remains on Herceptin /Perjetta and is doing will with those treatments.

## 2021-05-01 ENCOUNTER — Inpatient Hospital Stay: Payer: Managed Care, Other (non HMO)

## 2021-05-01 VITALS — BP 120/65 | HR 83 | Temp 98.1°F | Resp 18 | Ht 64.0 in | Wt 192.5 lb

## 2021-05-01 DIAGNOSIS — Z5112 Encounter for antineoplastic immunotherapy: Secondary | ICD-10-CM | POA: Diagnosis not present

## 2021-05-01 DIAGNOSIS — C50419 Malignant neoplasm of upper-outer quadrant of unspecified female breast: Secondary | ICD-10-CM

## 2021-05-01 MED ORDER — DIPHENHYDRAMINE HCL 25 MG PO CAPS: 50.0000 mg | ORAL_CAPSULE | Freq: Once | ORAL | Status: DC

## 2021-05-01 MED ORDER — SODIUM CHLORIDE 0.9 % IV SOLN
420.0000 mg | Freq: Once | INTRAVENOUS | Status: AC
  Administered 2021-05-01: 420 mg via INTRAVENOUS
  Filled 2021-05-01: qty 14

## 2021-05-01 MED ORDER — SODIUM CHLORIDE 0.9 % IV SOLN: Freq: Once | INTRAVENOUS | Status: AC

## 2021-05-01 MED ORDER — ACETAMINOPHEN 325 MG PO TABS: 650.0000 mg | ORAL_TABLET | Freq: Once | ORAL | Status: DC

## 2021-05-01 MED ORDER — SODIUM CHLORIDE 0.9% FLUSH
10.0000 mL | INTRAVENOUS | Status: DC | PRN
  Administered 2021-05-01: 10 mL

## 2021-05-01 MED ORDER — HEPARIN SOD (PORK) LOCK FLUSH 100 UNIT/ML IV SOLN
500.0000 [IU] | Freq: Once | INTRAVENOUS | Status: AC | PRN
  Administered 2021-05-01: 500 [IU]

## 2021-05-01 MED ORDER — TRASTUZUMAB-ANNS CHEMO 150 MG IV SOLR
6.0000 mg/kg | Freq: Once | INTRAVENOUS | Status: AC
  Administered 2021-05-01: 525 mg via INTRAVENOUS
  Filled 2021-05-01: qty 25

## 2021-05-01 NOTE — Progress Notes (Signed)
1601:PT STABLE AT TIME OF DISCHARGE

## 2021-05-01 NOTE — Patient Instructions (Signed)
[facility]  Discharge Instructions: Thank you for choosing [facility] to provide your oncology and hematology care.  If you have a lab appointment with the [facility], please go directly to the [facility] and check in at the registration area.   Wear comfortable clothing and clothing appropriate for easy access to any Portacath or PICC line.   We strive to give you quality time with your provider. You may need to reschedule your appointment if you arrive late (15 or more minutes).  Arriving late affects you and other patients whose appointments are after yours.  Also, if you miss three or more appointments without notifying the office, you may be dismissed from the clinic at the provider's discretion.      For prescription refill requests, have your pharmacy contact our office and allow 72 hours for refills to be completed.    Today you received the following chemotherapy and/or immunotherapy agents  Trastuzumab, Pertuzumab   To help prevent nausea and vomiting after your treatment, we encourage you to take your nausea medication as directed.  BELOW ARE SYMPTOMS THAT SHOULD BE REPORTED IMMEDIATELY: *FEVER GREATER THAN 100.4 F (38 C) OR HIGHER *CHILLS OR SWEATING *NAUSEA AND VOMITING THAT IS NOT CONTROLLED WITH YOUR NAUSEA MEDICATION *UNUSUAL SHORTNESS OF BREATH *UNUSUAL BRUISING OR BLEEDING *URINARY PROBLEMS (pain or burning when urinating, or frequent urination) *BOWEL PROBLEMS (unusual diarrhea, constipation, pain near the anus) TENDERNESS IN MOUTH AND THROAT WITH OR WITHOUT PRESENCE OF ULCERS (sore throat, sores in mouth, or a toothache) UNUSUAL RASH, SWELLING OR PAIN  UNUSUAL VAGINAL DISCHARGE OR ITCHING   Items with * indicate a potential emergency and should be followed up as soon as possible or go to the Emergency Department if any problems should occur.  Please show the CHEMOTHERAPY ALERT CARD or IMMUNOTHERAPY ALERT CARD at check-in  to the Emergency Department and triage nurse.  Should you have questions after your visit or need to cancel or reschedule your appointment, please contact [facility]  Dept: [phone]  and follow the prompts.  Office hours are 8:00 a.m. to 4:30 p.m. Monday - Friday. Please note that voicemails left after 4:00 p.m. may not be returned until the following business day.  We are closed weekends and major holidays. You have access to a nurse at all times for urgent questions. Please call the main number to the clinic Dept: [phone] and follow the prompts.  For any non-urgent questions, you may also contact your provider using MyChart. We now offer e-Visits for anyone 16 and older to request care online for non-urgent symptoms. For details visit [email or web address].   Also download the MyChart app! Go to the app store, search "MyChart", open the app, select [facility], and log in with your MyChart username and password.  Due to Covid, a mask is required upon entering the hospital/clinic. If you do not have a mask, one will be given to you upon arrival. For doctor visits, patients may have 1 support person aged 27 or older with them. For treatment visits, patients cannot have anyone with them due to current Covid guidelines and our immunocompromised population.

## 2021-05-04 ENCOUNTER — Telehealth: Payer: Self-pay

## 2021-05-04 NOTE — Telephone Encounter (Signed)
Pt notified of below. She is seeing her PCP @ 4pm.   RE: Hoarse, teeth & face hurt Received: Today Mosher, Beryle Flock, RN PCP or urgent care please, she is only on HER2 treatment counts are fine.        Previous Messages  I spoke with pt. She has taken 2 at home COVID test, both were negative on Tuesday & Thursday. She is having green congestion from blowing her nose and coughing. Afebrile. She wants to know if we need to see her or her PCP.

## 2021-05-21 ENCOUNTER — Other Ambulatory Visit: Payer: Self-pay | Admitting: Hematology and Oncology

## 2021-05-21 ENCOUNTER — Inpatient Hospital Stay: Payer: Managed Care, Other (non HMO) | Attending: Oncology

## 2021-05-21 DIAGNOSIS — C50411 Malignant neoplasm of upper-outer quadrant of right female breast: Secondary | ICD-10-CM

## 2021-05-21 DIAGNOSIS — Z79899 Other long term (current) drug therapy: Secondary | ICD-10-CM | POA: Insufficient documentation

## 2021-05-21 DIAGNOSIS — Z86718 Personal history of other venous thrombosis and embolism: Secondary | ICD-10-CM | POA: Insufficient documentation

## 2021-05-21 DIAGNOSIS — Z86711 Personal history of pulmonary embolism: Secondary | ICD-10-CM | POA: Insufficient documentation

## 2021-05-21 DIAGNOSIS — C50911 Malignant neoplasm of unspecified site of right female breast: Secondary | ICD-10-CM | POA: Insufficient documentation

## 2021-05-21 DIAGNOSIS — Z5112 Encounter for antineoplastic immunotherapy: Secondary | ICD-10-CM | POA: Insufficient documentation

## 2021-05-21 DIAGNOSIS — Z7901 Long term (current) use of anticoagulants: Secondary | ICD-10-CM | POA: Insufficient documentation

## 2021-05-21 DIAGNOSIS — Z171 Estrogen receptor negative status [ER-]: Secondary | ICD-10-CM | POA: Insufficient documentation

## 2021-05-21 LAB — HEPATIC FUNCTION PANEL
ALT: 24 (ref 7–35)
AST: 27 (ref 13–35)
Alkaline Phosphatase: 68 (ref 25–125)
Bilirubin, Total: 0.5

## 2021-05-21 LAB — BASIC METABOLIC PANEL
BUN: 16 (ref 4–21)
CO2: 29 — AB (ref 13–22)
Chloride: 108 (ref 99–108)
Creatinine: 0.9 (ref 0.5–1.1)
Glucose: 104
Potassium: 4.1 (ref 3.4–5.3)
Sodium: 142 (ref 137–147)

## 2021-05-21 LAB — CBC
MCV: 86 (ref 81–99)
RBC: 4.82 (ref 3.87–5.11)

## 2021-05-21 LAB — CBC AND DIFFERENTIAL
HCT: 42 (ref 36–46)
Hemoglobin: 13.8 (ref 12.0–16.0)
Neutrophils Absolute: 2.96
Platelets: 213 (ref 150–399)
WBC: 5.2

## 2021-05-21 LAB — COMPREHENSIVE METABOLIC PANEL
Albumin: 4.3 (ref 3.5–5.0)
Calcium: 9 (ref 8.7–10.7)

## 2021-05-21 MED FILL — Pertuzumab Soln for IV Infusion 420 MG/14ML (30 MG/ML): INTRAVENOUS | Qty: 14 | Status: AC

## 2021-05-21 MED FILL — Trastuzumab-anns For IV Soln 150 MG: INTRAVENOUS | Qty: 25 | Status: AC

## 2021-05-22 ENCOUNTER — Other Ambulatory Visit: Payer: Self-pay

## 2021-05-22 ENCOUNTER — Inpatient Hospital Stay: Payer: Managed Care, Other (non HMO)

## 2021-05-22 VITALS — BP 103/53 | HR 67 | Temp 97.9°F | Resp 18 | Ht 64.0 in | Wt 191.0 lb

## 2021-05-22 DIAGNOSIS — Z86711 Personal history of pulmonary embolism: Secondary | ICD-10-CM | POA: Diagnosis not present

## 2021-05-22 DIAGNOSIS — Z171 Estrogen receptor negative status [ER-]: Secondary | ICD-10-CM | POA: Diagnosis not present

## 2021-05-22 DIAGNOSIS — Z79899 Other long term (current) drug therapy: Secondary | ICD-10-CM | POA: Diagnosis not present

## 2021-05-22 DIAGNOSIS — Z5112 Encounter for antineoplastic immunotherapy: Secondary | ICD-10-CM | POA: Diagnosis not present

## 2021-05-22 DIAGNOSIS — Z7901 Long term (current) use of anticoagulants: Secondary | ICD-10-CM | POA: Diagnosis not present

## 2021-05-22 DIAGNOSIS — C50911 Malignant neoplasm of unspecified site of right female breast: Secondary | ICD-10-CM | POA: Diagnosis present

## 2021-05-22 DIAGNOSIS — Z86718 Personal history of other venous thrombosis and embolism: Secondary | ICD-10-CM | POA: Diagnosis not present

## 2021-05-22 MED ORDER — HEPARIN SOD (PORK) LOCK FLUSH 100 UNIT/ML IV SOLN
500.0000 [IU] | Freq: Once | INTRAVENOUS | Status: AC | PRN
  Administered 2021-05-22: 500 [IU]

## 2021-05-22 MED ORDER — SODIUM CHLORIDE 0.9 % IV SOLN
420.0000 mg | Freq: Once | INTRAVENOUS | Status: AC
  Administered 2021-05-22: 420 mg via INTRAVENOUS
  Filled 2021-05-22: qty 14

## 2021-05-22 MED ORDER — DIPHENHYDRAMINE HCL 25 MG PO CAPS: 50.0000 mg | ORAL_CAPSULE | Freq: Once | ORAL | Status: DC

## 2021-05-22 MED ORDER — ACETAMINOPHEN 325 MG PO TABS: 650.0000 mg | ORAL_TABLET | Freq: Once | ORAL | Status: DC

## 2021-05-22 MED ORDER — SODIUM CHLORIDE 0.9 % IV SOLN: Freq: Once | INTRAVENOUS | Status: DC

## 2021-05-22 MED ORDER — SODIUM CHLORIDE 0.9% FLUSH
10.0000 mL | INTRAVENOUS | Status: DC | PRN
  Administered 2021-05-22: 10 mL

## 2021-05-22 MED ORDER — TRASTUZUMAB-ANNS CHEMO 150 MG IV SOLR
6.0000 mg/kg | Freq: Once | INTRAVENOUS | Status: AC
  Administered 2021-05-22: 525 mg via INTRAVENOUS
  Filled 2021-05-22: qty 25

## 2021-05-22 NOTE — Patient Instructions (Signed)
Trastuzumab injection for infusion What is this medication? TRASTUZUMAB (tras TOO zoo mab) is a monoclonal antibody. It is used to treat breast cancer and stomach cancer. This medicine may be used for other purposes; ask your health care provider or pharmacist if you have questions. COMMON BRAND NAME(S): Herceptin, Herzuma, KANJINTI, Ogivri, Ontruzant, Trazimera What should I tell my care team before I take this medication? They need to know if you have any of these conditions: heart disease heart failure lung or breathing disease, like asthma an unusual or allergic reaction to trastuzumab, benzyl alcohol, or other medications, foods, dyes, or preservatives pregnant or trying to get pregnant breast-feeding How should I use this medication? This drug is given as an infusion into a vein. It is administered in a hospital or clinic by a specially trained health care professional. Talk to your pediatrician regarding the use of this medicine in children. This medicine is not approved for use in children. Overdosage: If you think you have taken too much of this medicine contact a poison control center or emergency room at once. NOTE: This medicine is only for you. Do not share this medicine with others. What if I miss a dose? It is important not to miss a dose. Call your doctor or health care professional if you are unable to keep an appointment. What may interact with this medication? This medicine may interact with the following medications: certain types of chemotherapy, such as daunorubicin, doxorubicin, epirubicin, and idarubicin This list may not describe all possible interactions. Give your health care provider a list of all the medicines, herbs, non-prescription drugs, or dietary supplements you use. Also tell them if you smoke, drink alcohol, or use illegal drugs. Some items may interact with your medicine. What should I watch for while using this medication? Visit your doctor for checks  on your progress. Report any side effects. Continue your course of treatment even though you feel ill unless your doctor tells you to stop. Call your doctor or health care professional for advice if you get a fever, chills or sore throat, or other symptoms of a cold or flu. Do not treat yourself. Try to avoid being around people who are sick. You may experience fever, chills and shaking during your first infusion. These effects are usually mild and can be treated with other medicines. Report any side effects during the infusion to your health care professional. Fever and chills usually do not happen with later infusions. Do not become pregnant while taking this medicine or for 7 months after stopping it. Women should inform their doctor if they wish to become pregnant or think they might be pregnant. Women of child-bearing potential will need to have a negative pregnancy test before starting this medicine. There is a potential for serious side effects to an unborn child. Talk to your health care professional or pharmacist for more information. Do not breast-feed an infant while taking this medicine or for 7 months after stopping it. Women must use effective birth control with this medicine. What side effects may I notice from receiving this medication? Side effects that you should report to your doctor or health care professional as soon as possible: allergic reactions like skin rash, itching or hives, swelling of the face, lips, or tongue chest pain or palpitations cough dizziness feeling faint or lightheaded, falls fever general ill feeling or flu-like symptoms signs of worsening heart failure like breathing problems; swelling in your legs and feet unusually weak or tired Side effects that usually   do not require medical attention (report to your doctor or health care professional if they continue or are bothersome): bone pain changes in taste diarrhea joint pain nausea/vomiting weight  loss This list may not describe all possible side effects. Call your doctor for medical advice about side effects. You may report side effects to FDA at 1-800-FDA-1088. Where should I keep my medication? This drug is given in a hospital or clinic and will not be stored at home. NOTE: This sheet is a summary. It may not cover all possible information. If you have questions about this medicine, talk to your doctor, pharmacist, or health care provider.  2022 Elsevier/Gold Standard (2016-07-09 00:00:00) [facility]  Discharge Instructions: Thank you for choosing [facility] to provide your oncology and hematology care.  If you have a lab appointment with the [facility], please go directly to the [facility] and check in at the registration area.   Wear comfortable clothing and clothing appropriate for easy access to any Portacath or PICC line.   We strive to give you quality time with your provider. You may need to reschedule your appointment if you arrive late (15 or more minutes).  Arriving late affects you and other patients whose appointments are after yours.  Also, if you miss three or more appointments without notifying the office, you may be dismissed from the clinic at the provider's discretion.     Pertuzumab injection AND Pertuzumab i What is this medication? PERTUZUMAB (per TOOZ ue mab) is a monoclonal antibody. It is used to treat breast cancer. This medicine may be used for other purposes; ask your health care provider or pharmacist if you have questions. COMMON BRAND NAME(S): PERJETA What should I tell my care team before I take this medication? They need to know if you have any of these conditions: heart disease heart failure high blood pressure history of irregular heart beat recent or ongoing radiation therapy an unusual or allergic reaction to pertuzumab, other medicines, foods, dyes, or preservatives pregnant or trying to get  pregnant breast-feeding How should I use this medication? This medicine is for infusion into a vein. It is given by a health care professional in a hospital or clinic setting. Talk to your pediatrician regarding the use of this medicine in children. Special care may be needed. Overdosage: If you think you have taken too much of this medicine contact a poison control center or emergency room at once. NOTE: This medicine is only for you. Do not share this medicine with others. What if I miss a dose? It is important not to miss your dose. Call your doctor or health care professional if you are unable to keep an appointment. What may interact with this medication? Interactions are not expected. Give your health care provider a list of all the medicines, herbs, non-prescription drugs, or dietary supplements you use. Also tell them if you smoke, drink alcohol, or use illegal drugs. Some items may interact with your medicine. This list may not describe all possible interactions. Give your health care provider a list of all the medicines, herbs, non-prescription drugs, or dietary supplements you use. Also tell them if you smoke, drink alcohol, or use illegal drugs. Some items may interact with your medicine. What should I watch for while using this medication? Your condition will be monitored carefully while you are receiving this medicine. Report any side effects. Continue your course of treatment even though you feel ill unless your doctor  tells you to stop. Do not become pregnant while taking this medicine or for 7 months after stopping it. Women should inform their doctor if they wish to become pregnant or think they might be pregnant. Women of child-bearing potential will need to have a negative pregnancy test before starting this medicine. There is a potential for serious side effects to an unborn child. Talk to your health care professional or pharmacist for more information. Do not breast-feed an  infant while taking this medicine or for 7 months after stopping it. Women must use effective birth control with this medicine. Call your doctor or health care professional for advice if you get a fever, chills or sore throat, or other symptoms of a cold or flu. Do not treat yourself. Try to avoid being around people who are sick. You may experience fever, chills, and headache during the infusion. Report any side effects during the infusion to your health care professional. What side effects may I notice from receiving this medication? Side effects that you should report to your doctor or health care professional as soon as possible: breathing problems chest pain or palpitations dizziness feeling faint or lightheaded fever or chills skin rash, itching or hives sore throat swelling of the face, lips, or tongue swelling of the legs or ankles unusually weak or tired Side effects that usually do not require medical attention (report to your doctor or health care professional if they continue or are bothersome): diarrhea hair loss nausea, vomiting tiredness This list may not describe all possible side effects. Call your doctor for medical advice about side effects. You may report side effects to FDA at 1-800-FDA-1088. Where should I keep my medication? This drug is given in a hospital or clinic and will not be stored at home. NOTE: This sheet is a summary. It may not cover all possible information. If you have questions about this medicine, talk to your doctor, pharmacist, or health care provider.  2022 Elsevier/Gold Standard (2015-07-27 00:00:00)  For prescription refill requests, have your pharmacy contact our office and allow 72 hours for refills to be completed.    Today you received the following chemotherapy and/or immunotherapy agents: Trastuzumab i   To help prevent nausea and vomiting after your treatment, we encourage you to take your nausea medication as directed.  BELOW ARE  SYMPTOMS THAT SHOULD BE REPORTED IMMEDIATELY: *FEVER GREATER THAN 100.4 F (38 C) OR HIGHER *CHILLS OR SWEATING *NAUSEA AND VOMITING THAT IS NOT CONTROLLED WITH YOUR NAUSEA MEDICATION *UNUSUAL SHORTNESS OF BREATH *UNUSUAL BRUISING OR BLEEDING *URINARY PROBLEMS (pain or burning when urinating, or frequent urination) *BOWEL PROBLEMS (unusual diarrhea, constipation, pain near the anus) TENDERNESS IN MOUTH AND THROAT WITH OR WITHOUT PRESENCE OF ULCERS (sore throat, sores in mouth, or a toothache) UNUSUAL RASH, SWELLING OR PAIN  UNUSUAL VAGINAL DISCHARGE OR ITCHING   Items with * indicate a potential emergency and should be followed up as soon as possible or go to the Emergency Department if any problems should occur.  Please show the CHEMOTHERAPY ALERT CARD or IMMUNOTHERAPY ALERT CARD at check-in to the Emergency Department and triage nurse.  Should you have questions after your visit or need to cancel or reschedule your appointment, please contact [facility]  Dept: [phone]  and follow the prompts.  Office hours are 8:00 a.m. to 4:30 p.m. Monday - Friday. Please note that voicemails left after 4:00 p.m. may not be returned until the following business day.  We are closed weekends  and major holidays. You have access to a nurse at all times for urgent questions. Please call the main number to the clinic Dept: [phone] and follow the prompts.  For any non-urgent questions, you may also contact your provider using MyChart. We now offer e-Visits for anyone 81 and older to request care online for non-urgent symptoms. For details visit [email or web address].   Also download the MyChart app! Go to the app store, search "MyChart", open the app, select [facility], and log in with your MyChart username and password.  Due to Covid, a mask is required upon entering the hospital/clinic. If you do not have a mask, one will be given to you upon arrival. For doctor visits,  patients may have 1 support person aged 66 or older with them. For treatment visits, patients cannot have anyone with them due to current Covid guidelines and our immunocompromised population.

## 2021-05-23 ENCOUNTER — Encounter: Payer: Self-pay | Admitting: Oncology

## 2021-06-04 NOTE — Progress Notes (Signed)
[facility]  [address] [place],  [place]  [address] [phone]  Clinic Day:  06/11/2021  Referring physician: Orrin Brigham, MD  This document serves as a record of services personally performed by Dequincy Macarthur Critchley, MD. It was created on their behalf by [facility] E, a trained medical scribe. The creation of this record is based on the scribe's personal observations and the provider's statements to them.  HISTORY OF PRESENT ILLNESS:  The patient is a 60 y.o. 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  She is taking adjuvant Perjeta/Herceptin, which is being given once every 3 weeks for a full year.  She presents today prior to her 8th cycle of adjuvant therapy. She tolerated cycle 7 without any significant difficulty.  She does have intermittent diarrhea from the Perjeta component of her therapy, which has been managed with imodium.  She has already completed her adjuvant breast radiation, which she tolerated without any difficulties.  She denies having any particular changes in her breasts which concern her for early disease recurrence.    PHYSICAL EXAM:  Blood pressure 140/60, pulse 65, temperature 98.9 F (37.2 C), resp. rate 16, height _0  (1.626 m), weight 188 lb 9.6 oz (85.5 kg), last menstrual period 10/25/2015, SpO2 97 %. Wt Readings from Last 3 Encounters:  06/11/21 188 lb 9.6 oz (85.5 kg)  05/22/21 191 lb 0.6 oz (86.7 kg)  05/01/21 192 lb 8 oz (87.3 kg)   Body mass index is 32.37 kg/m. Performance status (ECOG): 0 - Asymptomatic Physical Exam Constitutional:      General: She is not in acute distress.    Appearance: Normal appearance. She is normal weight.  HENT:     Head: Normocephalic and atraumatic.     Mouth/Throat:     Pharynx: Oropharynx is clear. No oropharyngeal exudate.  Eyes:     General: No scleral icterus.    Extraocular Movements: Extraocular movements intact.     Conjunctiva/sclera: Conjunctivae  normal.     Pupils: Pupils are equal, round, and reactive to light.  Cardiovascular:     Rate and Rhythm: Normal rate and regular rhythm.     Pulses: Normal pulses.     Heart sounds: Normal heart sounds. No murmur heard.   No friction rub. No gallop.  Pulmonary:     Effort: Pulmonary effort is normal. No respiratory distress.     Breath sounds: Normal breath sounds.  Chest:  Breasts:    Right: No swelling, bleeding, inverted nipple, mass, nipple discharge or skin change.     Left: No swelling, bleeding, inverted nipple, mass, nipple discharge or skin change.  Abdominal:     General: Bowel sounds are normal. There is no distension.     Palpations: Abdomen is soft. There is no hepatomegaly, splenomegaly or mass.     Tenderness: There is no abdominal tenderness.  Musculoskeletal:        General: No tenderness. Normal range of motion.     Cervical back: Normal range of motion and neck supple.     Right lower leg: No edema.     Left lower leg: No edema.  Lymphadenopathy:     Cervical: No cervical adenopathy.     Right cervical: No superficial, deep or posterior cervical adenopathy.    Left cervical: No superficial, deep or posterior cervical adenopathy.     Upper Body:     Right upper body: No supraclavicular or axillary adenopathy.  Left upper body: No supraclavicular or axillary adenopathy.     Lower Body: No right inguinal adenopathy. No left inguinal adenopathy.  Skin:    General: Skin is warm and dry.     Coloration: Skin is not jaundiced.     Findings: No lesion or rash.  Neurological:     General: No focal deficit present.     Mental Status: She is alert and oriented to person, place, and time. Mental status is at baseline.  Psychiatric:        Mood and Affect: Mood normal.        Behavior: Behavior normal.        Thought Content: Thought content normal.        Judgment: Judgment normal.   LABS:  Latest Reference Range & Units 06/11/21 00:00  Sodium 137 - 147  141  (E)  Potassium 3.4 - 5.3  3.6 (E)  Chloride 99 - 108  108 (E)  CO2 13 - 22  27 ! (E)  Glucose  123 (E)  BUN 4 - 21  19 (E)  Creatinine 0.5 - 1.1  0.8 (E)  Calcium 8.7 - 10.7  8.8 (E)  Alkaline Phosphatase 25 - 125  67 (E)  Albumin 3.5 - 5.0  4.1 (E)  AST 13 - 35  26 (E)  ALT 7 - 35  23 (E)  Bilirubin, Total  0.6 (E)  WBC  4.0 (E)  RBC 3.87 - 5.11  4.56 (E)  Hemoglobin 12.0 - 16.0  13.2 (E)  HCT 36 - 46  39 (E)  MCV 81 - 99  86 (E)  Platelets 150 - 399  187 (E)  NEUT#  2.12 (E)    ASSESSMENT & PLAN:  A 60 y.o. 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  She will proceed with cycle 8 of Herceptin/Perjeta tomorrow. Clinically, she is doing well.  I will see her back in 6 weeks before she heads into her 10th cycle of adjuvant Herceptin/Perjeta, which is being given every 3 weeks.  The patient understands all the plans discussed today and is in agreement with them.    I, Rita Ohara, am acting as scribe for Marice Potter, MD    I have reviewed this report as typed by the medical scribe, and it is complete and accurate.  Dequincy Macarthur Critchley, MD

## 2021-06-08 ENCOUNTER — Encounter: Payer: Self-pay | Admitting: Oncology

## 2021-06-11 ENCOUNTER — Telehealth: Payer: Self-pay | Admitting: Oncology

## 2021-06-11 ENCOUNTER — Other Ambulatory Visit: Payer: Self-pay | Admitting: Hematology and Oncology

## 2021-06-11 ENCOUNTER — Other Ambulatory Visit: Payer: Self-pay

## 2021-06-11 ENCOUNTER — Inpatient Hospital Stay (INDEPENDENT_AMBULATORY_CARE_PROVIDER_SITE_OTHER): Payer: Managed Care, Other (non HMO) | Admitting: Oncology

## 2021-06-11 ENCOUNTER — Inpatient Hospital Stay: Payer: Managed Care, Other (non HMO) | Attending: Oncology

## 2021-06-11 VITALS — BP 140/60 | HR 65 | Temp 98.9°F | Resp 16 | Ht 64.0 in | Wt 188.6 lb

## 2021-06-11 DIAGNOSIS — C50411 Malignant neoplasm of upper-outer quadrant of right female breast: Secondary | ICD-10-CM

## 2021-06-11 DIAGNOSIS — Z171 Estrogen receptor negative status [ER-]: Secondary | ICD-10-CM | POA: Diagnosis not present

## 2021-06-11 DIAGNOSIS — C50412 Malignant neoplasm of upper-outer quadrant of left female breast: Secondary | ICD-10-CM

## 2021-06-11 DIAGNOSIS — Z5112 Encounter for antineoplastic immunotherapy: Secondary | ICD-10-CM | POA: Insufficient documentation

## 2021-06-11 DIAGNOSIS — C50911 Malignant neoplasm of unspecified site of right female breast: Secondary | ICD-10-CM | POA: Insufficient documentation

## 2021-06-11 DIAGNOSIS — Z923 Personal history of irradiation: Secondary | ICD-10-CM | POA: Insufficient documentation

## 2021-06-11 DIAGNOSIS — C50419 Malignant neoplasm of upper-outer quadrant of unspecified female breast: Secondary | ICD-10-CM

## 2021-06-11 LAB — BASIC METABOLIC PANEL
BUN: 19 (ref 4–21)
CO2: 27 — AB (ref 13–22)
Chloride: 108 (ref 99–108)
Creatinine: 0.8 (ref 0.5–1.1)
Glucose: 123
Potassium: 3.6 (ref 3.4–5.3)
Sodium: 141 (ref 137–147)

## 2021-06-11 LAB — CBC
MCV: 86 (ref 81–99)
RBC: 4.56 (ref 3.87–5.11)

## 2021-06-11 LAB — COMPREHENSIVE METABOLIC PANEL
Albumin: 4.1 (ref 3.5–5.0)
Calcium: 8.8 (ref 8.7–10.7)

## 2021-06-11 LAB — CBC AND DIFFERENTIAL
HCT: 39 (ref 36–46)
Hemoglobin: 13.2 (ref 12.0–16.0)
Neutrophils Absolute: 2.12
Platelets: 187 (ref 150–399)
WBC: 4

## 2021-06-11 LAB — HEPATIC FUNCTION PANEL
ALT: 23 (ref 7–35)
AST: 26 (ref 13–35)
Alkaline Phosphatase: 67 (ref 25–125)
Bilirubin, Total: 0.6

## 2021-06-11 MED FILL — Pertuzumab Soln for IV Infusion 420 MG/14ML (30 MG/ML): INTRAVENOUS | Qty: 14 | Status: AC

## 2021-06-11 MED FILL — Trastuzumab-anns For IV Soln 150 MG: INTRAVENOUS | Qty: 25 | Status: AC

## 2021-06-11 NOTE — Telephone Encounter (Signed)
Per 1/25 los next appt scheduled and confirmed with patient °

## 2021-06-12 ENCOUNTER — Inpatient Hospital Stay: Payer: Managed Care, Other (non HMO)

## 2021-06-12 VITALS — BP 123/56 | HR 69 | Temp 98.1°F | Resp 16 | Ht 64.0 in | Wt 190.5 lb

## 2021-06-12 DIAGNOSIS — Z923 Personal history of irradiation: Secondary | ICD-10-CM | POA: Diagnosis not present

## 2021-06-12 DIAGNOSIS — Z5112 Encounter for antineoplastic immunotherapy: Secondary | ICD-10-CM | POA: Diagnosis present

## 2021-06-12 DIAGNOSIS — C50911 Malignant neoplasm of unspecified site of right female breast: Secondary | ICD-10-CM | POA: Diagnosis present

## 2021-06-12 DIAGNOSIS — C50419 Malignant neoplasm of upper-outer quadrant of unspecified female breast: Secondary | ICD-10-CM

## 2021-06-12 DIAGNOSIS — Z171 Estrogen receptor negative status [ER-]: Secondary | ICD-10-CM

## 2021-06-12 MED ORDER — SODIUM CHLORIDE 0.9 % IV SOLN
Freq: Once | INTRAVENOUS | Status: AC
Start: 2021-06-12 — End: 2021-06-12

## 2021-06-12 MED ORDER — SODIUM CHLORIDE 0.9% FLUSH
10.0000 mL | INTRAVENOUS | Status: DC | PRN
  Administered 2021-06-12: 10 mL

## 2021-06-12 MED ORDER — DIPHENHYDRAMINE HCL 25 MG PO CAPS: 50.0000 mg | ORAL_CAPSULE | Freq: Once | ORAL | Status: DC

## 2021-06-12 MED ORDER — TRASTUZUMAB-ANNS CHEMO 150 MG IV SOLR
6.0000 mg/kg | Freq: Once | INTRAVENOUS | Status: AC
  Administered 2021-06-12: 525 mg via INTRAVENOUS
  Filled 2021-06-12: qty 25

## 2021-06-12 MED ORDER — ACETAMINOPHEN 325 MG PO TABS: 650.0000 mg | ORAL_TABLET | Freq: Once | ORAL | Status: DC

## 2021-06-12 MED ORDER — SODIUM CHLORIDE 0.9 % IV SOLN
420.0000 mg | Freq: Once | INTRAVENOUS | Status: AC
  Administered 2021-06-12: 420 mg via INTRAVENOUS
  Filled 2021-06-12: qty 14

## 2021-06-12 MED ORDER — HEPARIN SOD (PORK) LOCK FLUSH 100 UNIT/ML IV SOLN
500.0000 [IU] | Freq: Once | INTRAVENOUS | Status: AC | PRN
  Administered 2021-06-12: 500 [IU]

## 2021-06-12 NOTE — Progress Notes (Signed)
1546:PT STABLE AT TIME OF DISCHARGE  

## 2021-06-12 NOTE — Patient Instructions (Signed)
[facility]  Discharge Instructions: Thank you for choosing [facility] to provide your oncology and hematology care.  If you have a lab appointment with the [facility], please go directly to the [facility] and check in at the registration area.   Wear comfortable clothing and clothing appropriate for easy access to any Portacath or PICC line.   We strive to give you quality time with your provider. You may need to reschedule your appointment if you arrive late (15 or more minutes).  Arriving late affects you and other patients whose appointments are after yours.  Also, if you miss three or more appointments without notifying the office, you may be dismissed from the clinic at the provider's discretion.      For prescription refill requests, have your pharmacy contact our office and allow 72 hours for refills to be completed.    Today you received the following chemotherapy and/or immunotherapy agents Pertuzumab,Trastuzumab   To help prevent nausea and vomiting after your treatment, we encourage you to take your nausea medication as directed.  BELOW ARE SYMPTOMS THAT SHOULD BE REPORTED IMMEDIATELY: *FEVER GREATER THAN 100.4 F (38 C) OR HIGHER *CHILLS OR SWEATING *NAUSEA AND VOMITING THAT IS NOT CONTROLLED WITH YOUR NAUSEA MEDICATION *UNUSUAL SHORTNESS OF BREATH *UNUSUAL BRUISING OR BLEEDING *URINARY PROBLEMS (pain or burning when urinating, or frequent urination) *BOWEL PROBLEMS (unusual diarrhea, constipation, pain near the anus) TENDERNESS IN MOUTH AND THROAT WITH OR WITHOUT PRESENCE OF ULCERS (sore throat, sores in mouth, or a toothache) UNUSUAL RASH, SWELLING OR PAIN  UNUSUAL VAGINAL DISCHARGE OR ITCHING   Items with * indicate a potential emergency and should be followed up as soon as possible or go to the Emergency Department if any problems should occur.  Please show the CHEMOTHERAPY ALERT CARD or IMMUNOTHERAPY ALERT CARD at check-in  to the Emergency Department and triage nurse.  Should you have questions after your visit or need to cancel or reschedule your appointment, please contact [facility]  Dept: [phone]  and follow the prompts.  Office hours are 8:00 a.m. to 4:30 p.m. Monday - Friday. Please note that voicemails left after 4:00 p.m. may not be returned until the following business day.  We are closed weekends and major holidays. You have access to a nurse at all times for urgent questions. Please call the main number to the clinic Dept: [phone] and follow the prompts.  For any non-urgent questions, you may also contact your provider using MyChart. We now offer e-Visits for anyone 65 and older to request care online for non-urgent symptoms. For details visit [email or web address].   Also download the MyChart app! Go to the app store, search "MyChart", open the app, select [facility], and log in with your MyChart username and password.  Due to Covid, a mask is required upon entering the hospital/clinic. If you do not have a mask, one will be given to you upon arrival. For doctor visits, patients may have 1 support person aged 50 or older with them. For treatment visits, patients cannot have anyone with them due to current Covid guidelines and our immunocompromised population.

## 2021-06-15 ENCOUNTER — Encounter: Payer: Self-pay | Admitting: Oncology

## 2021-06-29 ENCOUNTER — Inpatient Hospital Stay: Payer: Managed Care, Other (non HMO)

## 2021-06-29 DIAGNOSIS — Z171 Estrogen receptor negative status [ER-]: Secondary | ICD-10-CM

## 2021-07-03 ENCOUNTER — Other Ambulatory Visit: Payer: Self-pay

## 2021-07-03 ENCOUNTER — Inpatient Hospital Stay: Payer: Managed Care, Other (non HMO)

## 2021-07-03 ENCOUNTER — Other Ambulatory Visit: Payer: Self-pay | Admitting: Pharmacist

## 2021-07-03 VITALS — BP 108/58 | HR 69 | Temp 99.1°F | Resp 18 | Ht 64.0 in | Wt 188.8 lb

## 2021-07-03 DIAGNOSIS — Z5112 Encounter for antineoplastic immunotherapy: Secondary | ICD-10-CM | POA: Diagnosis not present

## 2021-07-03 DIAGNOSIS — C50419 Malignant neoplasm of upper-outer quadrant of unspecified female breast: Secondary | ICD-10-CM

## 2021-07-03 DIAGNOSIS — Z171 Estrogen receptor negative status [ER-]: Secondary | ICD-10-CM

## 2021-07-03 MED ORDER — TRASTUZUMAB-ANNS CHEMO 150 MG IV SOLR
6.0000 mg/kg | Freq: Once | INTRAVENOUS | Status: AC
  Administered 2021-07-03: 525 mg via INTRAVENOUS
  Filled 2021-07-03: qty 25

## 2021-07-03 MED ORDER — ACETAMINOPHEN 325 MG PO TABS: 650.0000 mg | ORAL_TABLET | Freq: Once | ORAL | Status: DC

## 2021-07-03 MED ORDER — DIPHENHYDRAMINE HCL 25 MG PO CAPS: 50.0000 mg | ORAL_CAPSULE | Freq: Once | ORAL | Status: DC

## 2021-07-03 MED ORDER — SODIUM CHLORIDE 0.9 % IV SOLN: Freq: Once | INTRAVENOUS | Status: AC

## 2021-07-03 MED ORDER — SODIUM CHLORIDE 0.9 % IV SOLN
420.0000 mg | Freq: Once | INTRAVENOUS | Status: AC
  Administered 2021-07-03: 420 mg via INTRAVENOUS
  Filled 2021-07-03: qty 14

## 2021-07-03 MED ORDER — HEPARIN SOD (PORK) LOCK FLUSH 100 UNIT/ML IV SOLN
500.0000 [IU] | Freq: Once | INTRAVENOUS | Status: AC | PRN
  Administered 2021-07-03: 500 [IU]

## 2021-07-03 MED ORDER — SODIUM CHLORIDE 0.9% FLUSH
10.0000 mL | INTRAVENOUS | Status: DC | PRN
  Administered 2021-07-03: 10 mL

## 2021-07-03 NOTE — Patient Instructions (Signed)
[facility]  Discharge Instructions: Thank you for choosing [facility] to provide your oncology and hematology care.  If you have a lab appointment with the [facility], please go directly to the [facility] and check in at the registration area.   Wear comfortable clothing and clothing appropriate for easy access to any Portacath or PICC line.   We strive to give you quality time with your provider. You may need to reschedule your appointment if you arrive late (15 or more minutes).  Arriving late affects you and other patients whose appointments are after yours.  Also, if you miss three or more appointments without notifying the office, you may be dismissed from the clinic at the providers discretion.      For prescription refill requests, have your pharmacy contact our office and allow 72 hours for refills to be completed.    Today you received the following chemotherapy and/or immunotherapy agents Trastuzumab,Pertuzumab   To help prevent nausea and vomiting after your treatment, we encourage you to take your nausea medication as directed.  BELOW ARE SYMPTOMS THAT SHOULD BE REPORTED IMMEDIATELY: *FEVER GREATER THAN 100.4 F (38 C) OR HIGHER *CHILLS OR SWEATING *NAUSEA AND VOMITING THAT IS NOT CONTROLLED WITH YOUR NAUSEA MEDICATION *UNUSUAL SHORTNESS OF BREATH *UNUSUAL BRUISING OR BLEEDING *URINARY PROBLEMS (pain or burning when urinating, or frequent urination) *BOWEL PROBLEMS (unusual diarrhea, constipation, pain near the anus) TENDERNESS IN MOUTH AND THROAT WITH OR WITHOUT PRESENCE OF ULCERS (sore throat, sores in mouth, or a toothache) UNUSUAL RASH, SWELLING OR PAIN  UNUSUAL VAGINAL DISCHARGE OR ITCHING   Items with * indicate a potential emergency and should be followed up as soon as possible or go to the Emergency Department if any problems should occur.  Please show the CHEMOTHERAPY ALERT CARD or IMMUNOTHERAPY ALERT CARD at check-in  to the Emergency Department and triage nurse.  Should you have questions after your visit or need to cancel or reschedule your appointment, please contact [facility]  Dept: [phone]  and follow the prompts.  Office hours are 8:00 a.m. to 4:30 p.m. Monday - Friday. Please note that voicemails left after 4:00 p.m. may not be returned until the following business day.  We are closed weekends and major holidays. You have access to a nurse at all times for urgent questions. Please call the main number to the clinic Dept: [phone] and follow the prompts.  For any non-urgent questions, you may also contact your provider using MyChart. We now offer e-Visits for anyone 51 and older to request care online for non-urgent symptoms. For details visit [email or web address].   Also download the MyChart app! Go to the app store, search "MyChart", open the app, select [facility], and log in with your MyChart username and password.  Due to Covid, a mask is required upon entering the hospital/clinic. If you do not have a mask, one will be given to you upon arrival. For doctor visits, patients may have 1 support person aged 78 or older with them. For treatment visits, patients cannot have anyone with them due to current Covid guidelines and our immunocompromised population.

## 2021-07-03 NOTE — Progress Notes (Signed)
1206:PT STABLE AT TIME OF DISCHARGE

## 2021-07-05 ENCOUNTER — Encounter: Payer: Self-pay | Admitting: Oncology

## 2021-07-05 ENCOUNTER — Telehealth: Payer: Self-pay | Admitting: Oncology

## 2021-07-05 NOTE — Progress Notes (Unsigned)
Per [employer] for HCP web-page:   93306 - ECHOCARDIOGRAPHY, TRANSTHORACIC, REAL-TIME WITH IMAGE DOCUMENTATION (2D), INCLUDES M-MODE RECORDING, WHEN PERFORMED, COMPLETE, WITH SPECTRAL DOPPLER ECHOCARDIOGRAPHY, AND WITH COLOR FLOW DOPPLER ECHOCARDIOGRAPHY  In-Network No Precertification Required  Prior authorization requirements may be different based on the network information for the servicing provider.   Place of Service: [facility]

## 2021-07-05 NOTE — Telephone Encounter (Signed)
07/05/21 left msg-ECHO on 07/17/21 arrive at 1030am

## 2021-07-17 DIAGNOSIS — I361 Nonrheumatic tricuspid (valve) insufficiency: Secondary | ICD-10-CM | POA: Diagnosis not present

## 2021-07-17 NOTE — Progress Notes (Signed)
[facility]  [address] [place],  [place]  [address] [phone]  Clinic Day:  07/23/2021  Referring physician: Orrin Brigham, MD  This document serves as a record of services personally performed by Dequincy Macarthur Critchley, MD. It was created on their behalf by [facility] E, a trained medical scribe. The creation of this record is based on the scribe's personal observations and the provider's statements to them.  HISTORY OF PRESENT ILLNESS:  The patient is a 61 y.o. 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  She is taking adjuvant Perjeta/Herceptin, which is being given once every 3 weeks for a full year.  She presents today prior to her 10th cycle of adjuvant therapy. She tolerated cycle 9 without any significant difficulty.  She still has diarrhea from the Perjeta component of her therapy, which has been managed with imodium.  As it pertains to her disease, she denies having any particular changes in her breasts which concern her for early disease recurrence.    PHYSICAL EXAM:  Blood pressure 111/62, pulse 65, temperature 97.9 F (36.6 C), resp. rate 14, height _0  (1.626 m), weight 188 lb 8 oz (85.5 kg), last menstrual period 10/25/2015, SpO2 97 %. Wt Readings from Last 3 Encounters:  07/23/21 188 lb 8 oz (85.5 kg)  07/03/21 188 lb 12 oz (85.6 kg)  06/12/21 190 lb 8 oz (86.4 kg)   Body mass index is 32.36 kg/m. Performance status (ECOG): 0 - Asymptomatic Physical Exam Constitutional:      General: She is not in acute distress.    Appearance: Normal appearance. She is normal weight.  HENT:     Head: Normocephalic and atraumatic.     Mouth/Throat:     Pharynx: Oropharynx is clear. No oropharyngeal exudate.  Eyes:     General: No scleral icterus.    Extraocular Movements: Extraocular movements intact.     Conjunctiva/sclera: Conjunctivae normal.     Pupils: Pupils are equal, round, and reactive to light.  Cardiovascular:      Rate and Rhythm: Normal rate and regular rhythm.     Pulses: Normal pulses.     Heart sounds: Normal heart sounds. No murmur heard.   No friction rub. No gallop.  Pulmonary:     Effort: Pulmonary effort is normal. No respiratory distress.     Breath sounds: Normal breath sounds.  Chest:  Breasts:    Right: No swelling, bleeding, inverted nipple, mass, nipple discharge or skin change.     Left: No swelling, bleeding, inverted nipple, mass, nipple discharge or skin change.  Abdominal:     General: Bowel sounds are normal. There is no distension.     Palpations: Abdomen is soft. There is no hepatomegaly, splenomegaly or mass.     Tenderness: There is no abdominal tenderness.  Musculoskeletal:        General: No tenderness. Normal range of motion.     Cervical back: Normal range of motion and neck supple.     Right lower leg: No edema.     Left lower leg: No edema.  Lymphadenopathy:     Cervical: No cervical adenopathy.     Right cervical: No superficial, deep or posterior cervical adenopathy.    Left cervical: No superficial, deep or posterior cervical adenopathy.     Upper Body:     Right upper body: No supraclavicular or axillary adenopathy.     Left upper body: No supraclavicular or axillary adenopathy.  Lower Body: No right inguinal adenopathy. No left inguinal adenopathy.  Skin:    General: Skin is warm and dry.     Coloration: Skin is not jaundiced.     Findings: No lesion or rash.  Neurological:     General: No focal deficit present.     Mental Status: She is alert and oriented to person, place, and time. Mental status is at baseline.  Psychiatric:        Mood and Affect: Mood normal.        Behavior: Behavior normal.        Thought Content: Thought content normal.        Judgment: Judgment normal.   LABS:  Latest Reference Range & Units 07/23/21 00:00  Sodium 137 - 147  140  Potassium 3.4 - 5.3  3.9  Chloride 99 - 108  106  CO2 13 - 22  26 !  Glucose  101  BUN  4 - 21  14  Creatinine 0.5 - 1.1  0.8  Calcium 8.7 - 10.7  8.8  Alkaline Phosphatase 25 - 125  73  Albumin 3.5 - 5.0  4.2  AST 13 - 35  28  ALT 7 - 35  23  Bilirubin, Total  0.6  WBC  4.3  RBC 3.87 - 5.11  4.68  Hemoglobin 12.0 - 16.0  13.2  HCT 36 - 46  41  Platelets 150 - 399  188  NEUT#  2.37    ASSESSMENT & PLAN:  A 61 y.o. 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  She will proceed with cycle 10 of Herceptin/Perjeta tomorrow. Clinically, she is doing well.  I will see her back in 6 weeks before she heads into her 12th cycle of adjuvant Herceptin/Perjeta, which is being given every 3 weeks.  The patient understands all the plans discussed today and is in agreement with them.    I, Rita Ohara, am acting as scribe for Marice Potter, MD    I have reviewed this report as typed by the medical scribe, and it is complete and accurate.  Dequincy Macarthur Critchley, MD

## 2021-07-23 ENCOUNTER — Inpatient Hospital Stay (INDEPENDENT_AMBULATORY_CARE_PROVIDER_SITE_OTHER): Payer: Managed Care, Other (non HMO) | Admitting: Oncology

## 2021-07-23 ENCOUNTER — Inpatient Hospital Stay: Payer: Managed Care, Other (non HMO) | Attending: Oncology

## 2021-07-23 ENCOUNTER — Encounter: Payer: Self-pay | Admitting: Oncology

## 2021-07-23 ENCOUNTER — Other Ambulatory Visit: Payer: Self-pay

## 2021-07-23 VITALS — BP 111/62 | HR 65 | Temp 97.9°F | Resp 14 | Ht 64.0 in | Wt 188.5 lb

## 2021-07-23 DIAGNOSIS — Z5112 Encounter for antineoplastic immunotherapy: Secondary | ICD-10-CM | POA: Insufficient documentation

## 2021-07-23 DIAGNOSIS — Z171 Estrogen receptor negative status [ER-]: Secondary | ICD-10-CM

## 2021-07-23 DIAGNOSIS — C50412 Malignant neoplasm of upper-outer quadrant of left female breast: Secondary | ICD-10-CM | POA: Diagnosis not present

## 2021-07-23 DIAGNOSIS — R197 Diarrhea, unspecified: Secondary | ICD-10-CM | POA: Insufficient documentation

## 2021-07-23 DIAGNOSIS — C50419 Malignant neoplasm of upper-outer quadrant of unspecified female breast: Secondary | ICD-10-CM

## 2021-07-23 DIAGNOSIS — Z170 Estrogen receptor positive status [ER+]: Secondary | ICD-10-CM | POA: Insufficient documentation

## 2021-07-23 DIAGNOSIS — C50411 Malignant neoplasm of upper-outer quadrant of right female breast: Secondary | ICD-10-CM

## 2021-07-23 DIAGNOSIS — Z923 Personal history of irradiation: Secondary | ICD-10-CM | POA: Insufficient documentation

## 2021-07-23 DIAGNOSIS — C50911 Malignant neoplasm of unspecified site of right female breast: Secondary | ICD-10-CM | POA: Insufficient documentation

## 2021-07-23 LAB — HEPATIC FUNCTION PANEL
ALT: 23 (ref 7–35)
AST: 28 (ref 13–35)
Alkaline Phosphatase: 73 (ref 25–125)
Bilirubin, Total: 0.6

## 2021-07-23 LAB — BASIC METABOLIC PANEL
BUN: 14 (ref 4–21)
CO2: 26 — AB (ref 13–22)
Chloride: 106 (ref 99–108)
Creatinine: 0.8 (ref 0.5–1.1)
Glucose: 101
Potassium: 3.9 (ref 3.4–5.3)
Sodium: 140 (ref 137–147)

## 2021-07-23 LAB — CBC AND DIFFERENTIAL
HCT: 41 (ref 36–46)
Hemoglobin: 13.2 (ref 12.0–16.0)
Neutrophils Absolute: 2.37
Platelets: 188 (ref 150–399)
WBC: 4.3

## 2021-07-23 LAB — CBC: RBC: 4.68 (ref 3.87–5.11)

## 2021-07-23 LAB — COMPREHENSIVE METABOLIC PANEL
Albumin: 4.2 (ref 3.5–5.0)
Calcium: 8.8 (ref 8.7–10.7)

## 2021-07-23 MED FILL — Trastuzumab-anns For IV Soln 150 MG: INTRAVENOUS | Qty: 25 | Status: AC

## 2021-07-23 MED FILL — Pertuzumab Soln for IV Infusion 420 MG/14ML (30 MG/ML): INTRAVENOUS | Qty: 14 | Status: AC

## 2021-07-24 ENCOUNTER — Inpatient Hospital Stay: Payer: Managed Care, Other (non HMO)

## 2021-07-24 VITALS — BP 110/80 | HR 75 | Temp 97.6°F | Resp 16 | Ht 64.0 in | Wt 190.1 lb

## 2021-07-24 DIAGNOSIS — R197 Diarrhea, unspecified: Secondary | ICD-10-CM | POA: Diagnosis not present

## 2021-07-24 DIAGNOSIS — Z5112 Encounter for antineoplastic immunotherapy: Secondary | ICD-10-CM | POA: Diagnosis present

## 2021-07-24 DIAGNOSIS — C50911 Malignant neoplasm of unspecified site of right female breast: Secondary | ICD-10-CM | POA: Diagnosis present

## 2021-07-24 DIAGNOSIS — Z923 Personal history of irradiation: Secondary | ICD-10-CM | POA: Diagnosis not present

## 2021-07-24 DIAGNOSIS — Z170 Estrogen receptor positive status [ER+]: Secondary | ICD-10-CM | POA: Diagnosis not present

## 2021-07-24 DIAGNOSIS — Z171 Estrogen receptor negative status [ER-]: Secondary | ICD-10-CM

## 2021-07-24 MED ORDER — ACETAMINOPHEN 325 MG PO TABS: 650.0000 mg | ORAL_TABLET | Freq: Once | ORAL | Status: DC

## 2021-07-24 MED ORDER — SODIUM CHLORIDE 0.9 % IV SOLN
420.0000 mg | Freq: Once | INTRAVENOUS | Status: AC
  Administered 2021-07-24: 420 mg via INTRAVENOUS
  Filled 2021-07-24: qty 14

## 2021-07-24 MED ORDER — SODIUM CHLORIDE 0.9 % IV SOLN: Freq: Once | INTRAVENOUS | Status: AC

## 2021-07-24 MED ORDER — TRASTUZUMAB-ANNS CHEMO 150 MG IV SOLR
6.0000 mg/kg | Freq: Once | INTRAVENOUS | Status: AC
  Administered 2021-07-24: 525 mg via INTRAVENOUS
  Filled 2021-07-24: qty 25

## 2021-07-24 MED ORDER — SODIUM CHLORIDE 0.9% FLUSH: 10.0000 mL | INTRAVENOUS | Status: DC | PRN

## 2021-07-24 MED ORDER — HEPARIN SOD (PORK) LOCK FLUSH 100 UNIT/ML IV SOLN: 500.0000 [IU] | Freq: Once | INTRAVENOUS | Status: DC | PRN

## 2021-07-24 MED ORDER — DIPHENHYDRAMINE HCL 25 MG PO CAPS: 50.0000 mg | ORAL_CAPSULE | Freq: Once | ORAL | Status: DC

## 2021-07-24 NOTE — Patient Instructions (Signed)
Pertuzumab injection What is this medication? PERTUZUMAB (per TOOZ ue mab) is a monoclonal antibody. It is used to treat breast cancer. This medicine may be used for other purposes; ask your health care provider or pharmacist if you have questions. COMMON BRAND NAME(S): PERJETA What should I tell my care team before I take this medication? They need to know if you have any of these conditions: heart disease heart failure high blood pressure history of irregular heart beat recent or ongoing radiation therapy an unusual or allergic reaction to pertuzumab, other medicines, foods, dyes, or preservatives pregnant or trying to get pregnant breast-feeding How should I use this medication? This medicine is for infusion into a vein. It is given by a health care professional in a hospital or clinic setting. Talk to your pediatrician regarding the use of this medicine in children. Special care may be needed. Overdosage: If you think you have taken too much of this medicine contact a poison control center or emergency room at once. NOTE: This medicine is only for you. Do not share this medicine with others. What if I miss a dose? It is important not to miss your dose. Call your doctor or health care professional if you are unable to keep an appointment. What may interact with this medication? Interactions are not expected. Give your health care provider a list of all the medicines, herbs, non-prescription drugs, or dietary supplements you use. Also tell them if you smoke, drink alcohol, or use illegal drugs. Some items may interact with your medicine. This list may not describe all possible interactions. Give your health care provider a list of all the medicines, herbs, non-prescription drugs, or dietary supplements you use. Also tell them if you smoke, drink alcohol, or use illegal drugs. Some items may interact with your medicine. What should I watch for while using this medication? Your condition  will be monitored carefully while you are receiving this medicine. Report any side effects. Continue your course of treatment even though you feel ill unless your doctor tells you to stop. Do not become pregnant while taking this medicine or for 7 months after stopping it. Women should inform their doctor if they wish to become pregnant or think they might be pregnant. Women of child-bearing potential will need to have a negative pregnancy test before starting this medicine. There is a potential for serious side effects to an unborn child. Talk to your health care professional or pharmacist for more information. Do not breast-feed an infant while taking this medicine or for 7 months after stopping it. Women must use effective birth control with this medicine. Call your doctor or health care professional for advice if you get a fever, chills or sore throat, or other symptoms of a cold or flu. Do not treat yourself. Try to avoid being around people who are sick. You may experience fever, chills, and headache during the infusion. Report any side effects during the infusion to your health care professional. What side effects may I notice from receiving this medication? Side effects that you should report to your doctor or health care professional as soon as possible: breathing problems chest pain or palpitations dizziness feeling faint or lightheaded fever or chills skin rash, itching or hives sore throat swelling of the face, lips, or tongue swelling of the legs or ankles unusually weak or tired Side effects that usually do not require medical attention (report to your doctor or health care professional if they continue or are bothersome): diarrhea hair loss  nausea, vomiting tiredness This list may not describe all possible side effects. Call your doctor for medical advice about side effects. You may report side effects to FDA at 1-800-FDA-1088. Where should I keep my medication? This drug is  given in a hospital or clinic and will not be stored at home. NOTE: This sheet is a summary. It may not cover all possible information. If you have questions about this medicine, talk to your doctor, pharmacist, or health care provider.  2022 Elsevier/Gold Standard (2015-07-27 00:00:00) Trastuzumab injection for infusion What is this medication? TRASTUZUMAB (tras TOO zoo mab) is a monoclonal antibody. It is used to treat breast cancer and stomach cancer. This medicine may be used for other purposes; ask your health care provider or pharmacist if you have questions. COMMON BRAND NAME(S): Herceptin, Janae Bridgeman, Ontruzant, Trazimera What should I tell my care team before I take this medication? They need to know if you have any of these conditions: heart disease heart failure lung or breathing disease, like asthma an unusual or allergic reaction to trastuzumab, benzyl alcohol, or other medications, foods, dyes, or preservatives pregnant or trying to get pregnant breast-feeding How should I use this medication? This drug is given as an infusion into a vein. It is administered in a hospital or clinic by a specially trained health care professional. Talk to your pediatrician regarding the use of this medicine in children. This medicine is not approved for use in children. Overdosage: If you think you have taken too much of this medicine contact a poison control center or emergency room at once. NOTE: This medicine is only for you. Do not share this medicine with others. What if I miss a dose? It is important not to miss a dose. Call your doctor or health care professional if you are unable to keep an appointment. What may interact with this medication? This medicine may interact with the following medications: certain types of chemotherapy, such as daunorubicin, doxorubicin, epirubicin, and idarubicin This list may not describe all possible interactions. Give your health care  provider a list of all the medicines, herbs, non-prescription drugs, or dietary supplements you use. Also tell them if you smoke, drink alcohol, or use illegal drugs. Some items may interact with your medicine. What should I watch for while using this medication? Visit your doctor for checks on your progress. Report any side effects. Continue your course of treatment even though you feel ill unless your doctor tells you to stop. Call your doctor or health care professional for advice if you get a fever, chills or sore throat, or other symptoms of a cold or flu. Do not treat yourself. Try to avoid being around people who are sick. You may experience fever, chills and shaking during your first infusion. These effects are usually mild and can be treated with other medicines. Report any side effects during the infusion to your health care professional. Fever and chills usually do not happen with later infusions. Do not become pregnant while taking this medicine or for 7 months after stopping it. Women should inform their doctor if they wish to become pregnant or think they might be pregnant. Women of child-bearing potential will need to have a negative pregnancy test before starting this medicine. There is a potential for serious side effects to an unborn child. Talk to your health care professional or pharmacist for more information. Do not breast-feed an infant while taking this medicine or for 7 months after stopping it. Women must use  effective birth control with this medicine. What side effects may I notice from receiving this medication? Side effects that you should report to your doctor or health care professional as soon as possible: allergic reactions like skin rash, itching or hives, swelling of the face, lips, or tongue chest pain or palpitations cough dizziness feeling faint or lightheaded, falls fever general ill feeling or flu-like symptoms signs of worsening heart failure like breathing  problems; swelling in your legs and feet unusually weak or tired Side effects that usually do not require medical attention (report to your doctor or health care professional if they continue or are bothersome): bone pain changes in taste diarrhea joint pain nausea/vomiting weight loss This list may not describe all possible side effects. Call your doctor for medical advice about side effects. You may report side effects to FDA at 1-800-FDA-1088. Where should I keep my medication? This drug is given in a hospital or clinic and will not be stored at home. NOTE: This sheet is a summary. It may not cover all possible information. If you have questions about this medicine, talk to your doctor, pharmacist, or health care provider.  2022 Elsevier/Gold Standard (2016-07-09 00:00:00)

## 2021-07-30 ENCOUNTER — Encounter: Payer: Self-pay | Admitting: Oncology

## 2021-08-06 NOTE — Progress Notes (Signed)
Spoke with patient regarding co-pay assistance, she has met her out of pocket. Her co-pay's will start over in July. I told her I would call her back then, if she needs me before she has my number.

## 2021-08-13 ENCOUNTER — Inpatient Hospital Stay: Payer: Managed Care, Other (non HMO) | Attending: Oncology

## 2021-08-13 ENCOUNTER — Other Ambulatory Visit: Payer: Self-pay | Admitting: Hematology and Oncology

## 2021-08-13 ENCOUNTER — Other Ambulatory Visit: Payer: Self-pay

## 2021-08-13 DIAGNOSIS — C50411 Malignant neoplasm of upper-outer quadrant of right female breast: Secondary | ICD-10-CM

## 2021-08-13 DIAGNOSIS — Z171 Estrogen receptor negative status [ER-]: Secondary | ICD-10-CM

## 2021-08-13 DIAGNOSIS — Z5112 Encounter for antineoplastic immunotherapy: Secondary | ICD-10-CM | POA: Insufficient documentation

## 2021-08-13 DIAGNOSIS — C50911 Malignant neoplasm of unspecified site of right female breast: Secondary | ICD-10-CM | POA: Insufficient documentation

## 2021-08-13 DIAGNOSIS — Z923 Personal history of irradiation: Secondary | ICD-10-CM | POA: Insufficient documentation

## 2021-08-13 DIAGNOSIS — Z170 Estrogen receptor positive status [ER+]: Secondary | ICD-10-CM | POA: Insufficient documentation

## 2021-08-13 LAB — CBC AND DIFFERENTIAL
HCT: 41 (ref 36–46)
Hemoglobin: 13.5 (ref 12.0–16.0)
Neutrophils Absolute: 2.45
Platelets: 206 (ref 150–399)
WBC: 4.3

## 2021-08-13 LAB — HEPATIC FUNCTION PANEL
ALT: 25 (ref 7–35)
AST: 31 (ref 13–35)
Alkaline Phosphatase: 70 (ref 25–125)
Bilirubin, Total: 0.5

## 2021-08-13 LAB — COMPREHENSIVE METABOLIC PANEL
Albumin: 4.1 (ref 3.5–5.0)
Calcium: 9 (ref 8.7–10.7)

## 2021-08-13 LAB — MAGNESIUM: Magnesium: 2.1

## 2021-08-13 LAB — BASIC METABOLIC PANEL
BUN: 17 (ref 4–21)
CO2: 25 — AB (ref 13–22)
Chloride: 109 — AB (ref 99–108)
Creatinine: 0.8 (ref 0.5–1.1)
Glucose: 102
Potassium: 4.1 (ref 3.4–5.3)
Sodium: 141 (ref 137–147)

## 2021-08-13 LAB — CBC: RBC: 4.69 (ref 3.87–5.11)

## 2021-08-13 MED FILL — Pertuzumab Soln for IV Infusion 420 MG/14ML (30 MG/ML): INTRAVENOUS | Qty: 14 | Status: AC

## 2021-08-13 MED FILL — Trastuzumab-anns For IV Soln 150 MG: INTRAVENOUS | Qty: 25 | Status: AC

## 2021-08-14 ENCOUNTER — Inpatient Hospital Stay: Payer: Managed Care, Other (non HMO)

## 2021-08-14 VITALS — BP 109/58 | HR 75 | Temp 97.9°F | Resp 18 | Ht 64.0 in | Wt 191.2 lb

## 2021-08-14 DIAGNOSIS — Z170 Estrogen receptor positive status [ER+]: Secondary | ICD-10-CM | POA: Diagnosis not present

## 2021-08-14 DIAGNOSIS — Z923 Personal history of irradiation: Secondary | ICD-10-CM | POA: Diagnosis not present

## 2021-08-14 DIAGNOSIS — C50911 Malignant neoplasm of unspecified site of right female breast: Secondary | ICD-10-CM | POA: Diagnosis present

## 2021-08-14 DIAGNOSIS — Z5112 Encounter for antineoplastic immunotherapy: Secondary | ICD-10-CM | POA: Diagnosis present

## 2021-08-14 DIAGNOSIS — Z171 Estrogen receptor negative status [ER-]: Secondary | ICD-10-CM

## 2021-08-14 MED ORDER — ACETAMINOPHEN 325 MG PO TABS: 650.0000 mg | ORAL_TABLET | Freq: Once | ORAL | Status: DC

## 2021-08-14 MED ORDER — DIPHENHYDRAMINE HCL 25 MG PO CAPS: 50.0000 mg | ORAL_CAPSULE | Freq: Once | ORAL | Status: DC

## 2021-08-14 MED ORDER — SODIUM CHLORIDE 0.9 % IV SOLN
420.0000 mg | Freq: Once | INTRAVENOUS | Status: AC
  Administered 2021-08-14: 420 mg via INTRAVENOUS
  Filled 2021-08-14: qty 14

## 2021-08-14 MED ORDER — SODIUM CHLORIDE 0.9 % IV SOLN: Freq: Once | INTRAVENOUS | Status: AC

## 2021-08-14 MED ORDER — SODIUM CHLORIDE 0.9% FLUSH
10.0000 mL | INTRAVENOUS | Status: DC | PRN
  Administered 2021-08-14: 10 mL

## 2021-08-14 MED ORDER — HEPARIN SOD (PORK) LOCK FLUSH 100 UNIT/ML IV SOLN
500.0000 [IU] | Freq: Once | INTRAVENOUS | Status: AC | PRN
  Administered 2021-08-14: 500 [IU]

## 2021-08-14 MED ORDER — TRASTUZUMAB-ANNS CHEMO 150 MG IV SOLR
6.0000 mg/kg | Freq: Once | INTRAVENOUS | Status: AC
  Administered 2021-08-14: 525 mg via INTRAVENOUS
  Filled 2021-08-14: qty 25

## 2021-08-14 NOTE — Patient Instructions (Signed)
[facility]  Discharge Instructions: Thank you for choosing [facility] to provide your oncology and hematology care.  If you have a lab appointment with the [facility], please go directly to the [facility] and check in at the registration area.   Wear comfortable clothing and clothing appropriate for easy access to any Portacath or PICC line.   We strive to give you quality time with your provider. You may need to reschedule your appointment if you arrive late (15 or more minutes).  Arriving late affects you and other patients whose appointments are after yours.  Also, if you miss three or more appointments without notifying the office, you may be dismissed from the clinic at the providers discretion.      For prescription refill requests, have your pharmacy contact our office and allow 72 hours for refills to be completed.    Today you received the following chemotherapy and/or immunotherapy agents trastuzumab pertuzumab      To help prevent nausea and vomiting after your treatment, we encourage you to take your nausea medication as directed.  BELOW ARE SYMPTOMS THAT SHOULD BE REPORTED IMMEDIATELY: *FEVER GREATER THAN 100.4 F (38 C) OR HIGHER *CHILLS OR SWEATING *NAUSEA AND VOMITING THAT IS NOT CONTROLLED WITH YOUR NAUSEA MEDICATION *UNUSUAL SHORTNESS OF BREATH *UNUSUAL BRUISING OR BLEEDING *URINARY PROBLEMS (pain or burning when urinating, or frequent urination) *BOWEL PROBLEMS (unusual diarrhea, constipation, pain near the anus) TENDERNESS IN MOUTH AND THROAT WITH OR WITHOUT PRESENCE OF ULCERS (sore throat, sores in mouth, or a toothache) UNUSUAL RASH, SWELLING OR PAIN  UNUSUAL VAGINAL DISCHARGE OR ITCHING   Items with * indicate a potential emergency and should be followed up as soon as possible or go to the Emergency Department if any problems should occur.  Please show the CHEMOTHERAPY ALERT CARD or IMMUNOTHERAPY ALERT CARD at  check-in to the Emergency Department and triage nurse.  Should you have questions after your visit or need to cancel or reschedule your appointment, please contact [facility]  Dept: [phone]  and follow the prompts.  Office hours are 8:00 a.m. to 4:30 p.m. Monday - Friday. Please note that voicemails left after 4:00 p.m. may not be returned until the following business day.  We are closed weekends and major holidays. You have access to a nurse at all times for urgent questions. Please call the main number to the clinic Dept: [phone] and follow the prompts.  For any non-urgent questions, you may also contact your provider using MyChart. We now offer e-Visits for anyone 86 and older to request care online for non-urgent symptoms. For details visit [email or web address].   Also download the MyChart app! Go to the app store, search "MyChart", open the app, select [facility], and log in with your MyChart username and password.  Due to Covid, a mask is required upon entering the hospital/clinic. If you do not have a mask, one will be given to you upon arrival. For doctor visits, patients may have 1 support person aged 40 or older with them. For treatment visits, patients cannot have anyone with them due to current Covid guidelines and our immunocompromised population.

## 2021-08-17 ENCOUNTER — Encounter: Payer: Self-pay | Admitting: Oncology

## 2021-08-28 NOTE — Progress Notes (Signed)
[facility]  [address] [place],  [place]  [address] [phone]  Clinic Day:  09/03/2021  Referring physician: Orrin Brigham, MD  This document serves as a record of services personally performed by Megin Consalvo Macarthur Critchley, MD. It was created on their behalf by [facility], a trained medical scribe. The creation of this record is based on the scribe's personal observations and the provider's statements to them.  HISTORY OF PRESENT ILLNESS:  The patient is a 61 y.o. 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  She is taking adjuvant Perjeta/Herceptin, which is being given once every 3 weeks for a full year.  She presents today prior to her 12th cycle of adjuvant therapy. She tolerated cycle 11 without any significant difficulty.  She still has diarrhea from the Perjeta component of her therapy, which remains well-managed with imodium.  As it pertains to her disease, she denies having any particular changes in her breasts which concern her for early disease recurrence.  Of note, she has significant sinus congestion which is leading to green sputum production.  PHYSICAL EXAM:  Blood pressure 133/69, pulse 69, temperature 98.1 F (36.7 C), resp. rate 16, height _0  (1.626 m), weight 191 lb 1.6 oz (86.7 kg), last menstrual period 10/25/2015, SpO2 98 %. Wt Readings from Last 3 Encounters:  09/03/21 191 lb 1.6 oz (86.7 kg)  08/14/21 191 lb 4 oz (86.8 kg)  07/24/21 190 lb 1.9 oz (86.2 kg)   Body mass index is 32.8 kg/m. Performance status (ECOG): 0 - Asymptomatic Physical Exam Constitutional:      General: She is not in acute distress.    Appearance: Normal appearance. She is normal weight.  HENT:     Head: Normocephalic and atraumatic.     Mouth/Throat:     Pharynx: Oropharynx is clear. No oropharyngeal exudate.  Eyes:     General: No scleral icterus.    Extraocular Movements: Extraocular movements intact.     Conjunctiva/sclera:  Conjunctivae normal.     Pupils: Pupils are equal, round, and reactive to light.  Cardiovascular:     Rate and Rhythm: Normal rate and regular rhythm.     Pulses: Normal pulses.     Heart sounds: Normal heart sounds. No murmur heard.   No friction rub. No gallop.  Pulmonary:     Effort: Pulmonary effort is normal. No respiratory distress.     Breath sounds: Normal breath sounds.  Chest:  Breasts:    Right: No swelling, bleeding, inverted nipple, mass, nipple discharge or skin change.     Left: No swelling, bleeding, inverted nipple, mass, nipple discharge or skin change.  Abdominal:     General: Bowel sounds are normal. There is no distension.     Palpations: Abdomen is soft. There is no hepatomegaly, splenomegaly or mass.     Tenderness: There is no abdominal tenderness.  Musculoskeletal:        General: No tenderness. Normal range of motion.     Cervical back: Normal range of motion and neck supple.     Right lower leg: No edema.     Left lower leg: No edema.  Lymphadenopathy:     Cervical: No cervical adenopathy.     Right cervical: No superficial, deep or posterior cervical adenopathy.    Left cervical: No superficial, deep or posterior cervical adenopathy.     Upper Body:     Right upper body: No supraclavicular or axillary adenopathy.  Left upper body: No supraclavicular or axillary adenopathy.     Lower Body: No right inguinal adenopathy. No left inguinal adenopathy.  Skin:    General: Skin is warm and dry.     Coloration: Skin is not jaundiced.     Findings: No lesion or rash.  Neurological:     General: No focal deficit present.     Mental Status: She is alert and oriented to person, place, and time. Mental status is at baseline.  Psychiatric:        Mood and Affect: Mood normal.        Behavior: Behavior normal.        Thought Content: Thought content normal.        Judgment: Judgment normal.   LABS:   ASSESSMENT & PLAN:  A 61 y.o. female with stage IA  (T1b N0 M0) her2 positive breast cancer, who has already completed her adjuvant breast radiation.  She will proceed with cycle 12 of Herceptin/Perjeta tomorrow. With respect to her sinusitis, she will be prescribed a course of Z-Pak.  Clinically, she appears to be doing well.  I will see her back in 6 weeks before she heads into her 14th cycle of adjuvant Herceptin/Perjeta, which is being given every 3 weeks.  The patient understands all the plans discussed today and is in agreement with them.    I, Rita Ohara, am acting as scribe for Marice Potter, MD    I have reviewed this report as typed by the medical scribe, and it is complete and accurate.  Juliauna Stueve Macarthur Critchley, MD

## 2021-09-03 ENCOUNTER — Other Ambulatory Visit: Payer: Self-pay | Admitting: Oncology

## 2021-09-03 ENCOUNTER — Inpatient Hospital Stay: Payer: Managed Care, Other (non HMO)

## 2021-09-03 ENCOUNTER — Other Ambulatory Visit: Payer: Self-pay

## 2021-09-03 ENCOUNTER — Encounter: Payer: Self-pay | Admitting: Oncology

## 2021-09-03 ENCOUNTER — Inpatient Hospital Stay (INDEPENDENT_AMBULATORY_CARE_PROVIDER_SITE_OTHER): Payer: Managed Care, Other (non HMO) | Admitting: Oncology

## 2021-09-03 ENCOUNTER — Telehealth: Payer: Self-pay | Admitting: Oncology

## 2021-09-03 VITALS — BP 133/69 | HR 69 | Temp 98.1°F | Resp 16 | Ht 64.0 in | Wt 191.1 lb

## 2021-09-03 DIAGNOSIS — Z171 Estrogen receptor negative status [ER-]: Secondary | ICD-10-CM | POA: Diagnosis not present

## 2021-09-03 DIAGNOSIS — C50411 Malignant neoplasm of upper-outer quadrant of right female breast: Secondary | ICD-10-CM

## 2021-09-03 DIAGNOSIS — C50419 Malignant neoplasm of upper-outer quadrant of unspecified female breast: Secondary | ICD-10-CM

## 2021-09-03 LAB — BASIC METABOLIC PANEL
BUN: 17 (ref 4–21)
CO2: 27 — AB (ref 13–22)
Chloride: 108 (ref 99–108)
Creatinine: 0.8 (ref 0.5–1.1)
Glucose: 97
Potassium: 3.8 (ref 3.4–5.3)
Sodium: 141 (ref 137–147)

## 2021-09-03 LAB — HEPATIC FUNCTION PANEL
ALT: 23 (ref 7–35)
AST: 24 (ref 13–35)
Alkaline Phosphatase: 71 (ref 25–125)
Bilirubin, Total: 0.7

## 2021-09-03 LAB — CBC: RBC: 4.53 (ref 3.87–5.11)

## 2021-09-03 LAB — COMPREHENSIVE METABOLIC PANEL
Albumin: 3.9 (ref 3.5–5.0)
Calcium: 8.9 (ref 8.7–10.7)

## 2021-09-03 LAB — CBC AND DIFFERENTIAL
HCT: 40 (ref 36–46)
Hemoglobin: 13.1 (ref 12.0–16.0)
Neutrophils Absolute: 3.37
Platelets: 195 (ref 150–399)
WBC: 5.1

## 2021-09-03 MED ORDER — AZITHROMYCIN 250 MG PO TABS: ORAL_TABLET | ORAL | 0 refills | Status: DC

## 2021-09-03 MED FILL — Pertuzumab Soln for IV Infusion 420 MG/14ML (30 MG/ML): INTRAVENOUS | Qty: 14 | Status: AC

## 2021-09-03 MED FILL — Trastuzumab-anns For IV Soln 150 MG: INTRAVENOUS | Qty: 25 | Status: AC

## 2021-09-03 NOTE — Telephone Encounter (Signed)
Patient has been scheduled for follow-up visit per 09/03/21 los. Pt given an appt calendar with date and time.

## 2021-09-04 ENCOUNTER — Inpatient Hospital Stay: Payer: Managed Care, Other (non HMO)

## 2021-09-04 ENCOUNTER — Encounter: Payer: Self-pay | Admitting: Oncology

## 2021-09-04 DIAGNOSIS — Z5112 Encounter for antineoplastic immunotherapy: Secondary | ICD-10-CM | POA: Diagnosis not present

## 2021-09-04 DIAGNOSIS — C50419 Malignant neoplasm of upper-outer quadrant of unspecified female breast: Secondary | ICD-10-CM

## 2021-09-04 MED ORDER — SODIUM CHLORIDE 0.9 % IV SOLN
420.0000 mg | Freq: Once | INTRAVENOUS | Status: AC
  Administered 2021-09-04: 420 mg via INTRAVENOUS
  Filled 2021-09-04: qty 14

## 2021-09-04 MED ORDER — HEPARIN SOD (PORK) LOCK FLUSH 100 UNIT/ML IV SOLN: 500.0000 [IU] | Freq: Once | INTRAVENOUS | Status: DC | PRN

## 2021-09-04 MED ORDER — ACETAMINOPHEN 325 MG PO TABS: 650.0000 mg | ORAL_TABLET | Freq: Once | ORAL | Status: DC

## 2021-09-04 MED ORDER — TRASTUZUMAB-ANNS CHEMO 150 MG IV SOLR
6.0000 mg/kg | Freq: Once | INTRAVENOUS | Status: AC
  Administered 2021-09-04: 525 mg via INTRAVENOUS
  Filled 2021-09-04: qty 25

## 2021-09-04 MED ORDER — SODIUM CHLORIDE 0.9 % IV SOLN: Freq: Once | INTRAVENOUS | Status: AC

## 2021-09-04 MED ORDER — DIPHENHYDRAMINE HCL 25 MG PO CAPS: 50.0000 mg | ORAL_CAPSULE | Freq: Once | ORAL | Status: DC

## 2021-09-04 MED ORDER — SODIUM CHLORIDE 0.9% FLUSH: 10.0000 mL | INTRAVENOUS | Status: DC | PRN

## 2021-09-04 NOTE — Patient Instructions (Signed)
Pertuzumab injection What is this medication? PERTUZUMAB (per TOOZ ue mab) is a monoclonal antibody. It is used to treat breast cancer. This medicine may be used for other purposes; ask your health care provider or pharmacist if you have questions. COMMON BRAND NAME(S): PERJETA What should I tell my care team before I take this medication? They need to know if you have any of these conditions: heart disease heart failure high blood pressure history of irregular heart beat recent or ongoing radiation therapy an unusual or allergic reaction to pertuzumab, other medicines, foods, dyes, or preservatives pregnant or trying to get pregnant breast-feeding How should I use this medication? This medicine is for infusion into a vein. It is given by a health care professional in a hospital or clinic setting. Talk to your pediatrician regarding the use of this medicine in children. Special care may be needed. Overdosage: If you think you have taken too much of this medicine contact a poison control center or emergency room at once. NOTE: This medicine is only for you. Do not share this medicine with others. What if I miss a dose? It is important not to miss your dose. Call your doctor or health care professional if you are unable to keep an appointment. What may interact with this medication? Interactions are not expected. Give your health care provider a list of all the medicines, herbs, non-prescription drugs, or dietary supplements you use. Also tell them if you smoke, drink alcohol, or use illegal drugs. Some items may interact with your medicine. This list may not describe all possible interactions. Give your health care provider a list of all the medicines, herbs, non-prescription drugs, or dietary supplements you use. Also tell them if you smoke, drink alcohol, or use illegal drugs. Some items may interact with your medicine. What should I watch for while using this medication? Your condition  will be monitored carefully while you are receiving this medicine. Report any side effects. Continue your course of treatment even though you feel ill unless your doctor tells you to stop. Do not become pregnant while taking this medicine or for 7 months after stopping it. Women should inform their doctor if they wish to become pregnant or think they might be pregnant. Women of child-bearing potential will need to have a negative pregnancy test before starting this medicine. There is a potential for serious side effects to an unborn child. Talk to your health care professional or pharmacist for more information. Do not breast-feed an infant while taking this medicine or for 7 months after stopping it. Women must use effective birth control with this medicine. Call your doctor or health care professional for advice if you get a fever, chills or sore throat, or other symptoms of a cold or flu. Do not treat yourself. Try to avoid being around people who are sick. You may experience fever, chills, and headache during the infusion. Report any side effects during the infusion to your health care professional. What side effects may I notice from receiving this medication? Side effects that you should report to your doctor or health care professional as soon as possible: breathing problems chest pain or palpitations dizziness feeling faint or lightheaded fever or chills skin rash, itching or hives sore throat swelling of the face, lips, or tongue swelling of the legs or ankles unusually weak or tired Side effects that usually do not require medical attention (report to your doctor or health care professional if they continue or are bothersome): diarrhea hair loss  nausea, vomiting tiredness This list may not describe all possible side effects. Call your doctor for medical advice about side effects. You may report side effects to FDA at 1-800-FDA-1088. Where should I keep my medication? This drug is  given in a hospital or clinic and will not be stored at home. NOTE: This sheet is a summary. It may not cover all possible information. If you have questions about this medicine, talk to your doctor, pharmacist, or health care provider.  2022 Elsevier/Gold Standard (2015-07-27 00:00:00) Trastuzumab injection for infusion What is this medication? TRASTUZUMAB (tras TOO zoo mab) is a monoclonal antibody. It is used to treat breast cancer and stomach cancer. This medicine may be used for other purposes; ask your health care provider or pharmacist if you have questions. COMMON BRAND NAME(S): Herceptin, Janae Bridgeman, Ontruzant, Trazimera What should I tell my care team before I take this medication? They need to know if you have any of these conditions: heart disease heart failure lung or breathing disease, like asthma an unusual or allergic reaction to trastuzumab, benzyl alcohol, or other medications, foods, dyes, or preservatives pregnant or trying to get pregnant breast-feeding How should I use this medication? This drug is given as an infusion into a vein. It is administered in a hospital or clinic by a specially trained health care professional. Talk to your pediatrician regarding the use of this medicine in children. This medicine is not approved for use in children. Overdosage: If you think you have taken too much of this medicine contact a poison control center or emergency room at once. NOTE: This medicine is only for you. Do not share this medicine with others. What if I miss a dose? It is important not to miss a dose. Call your doctor or health care professional if you are unable to keep an appointment. What may interact with this medication? This medicine may interact with the following medications: certain types of chemotherapy, such as daunorubicin, doxorubicin, epirubicin, and idarubicin This list may not describe all possible interactions. Give your health care  provider a list of all the medicines, herbs, non-prescription drugs, or dietary supplements you use. Also tell them if you smoke, drink alcohol, or use illegal drugs. Some items may interact with your medicine. What should I watch for while using this medication? Visit your doctor for checks on your progress. Report any side effects. Continue your course of treatment even though you feel ill unless your doctor tells you to stop. Call your doctor or health care professional for advice if you get a fever, chills or sore throat, or other symptoms of a cold or flu. Do not treat yourself. Try to avoid being around people who are sick. You may experience fever, chills and shaking during your first infusion. These effects are usually mild and can be treated with other medicines. Report any side effects during the infusion to your health care professional. Fever and chills usually do not happen with later infusions. Do not become pregnant while taking this medicine or for 7 months after stopping it. Women should inform their doctor if they wish to become pregnant or think they might be pregnant. Women of child-bearing potential will need to have a negative pregnancy test before starting this medicine. There is a potential for serious side effects to an unborn child. Talk to your health care professional or pharmacist for more information. Do not breast-feed an infant while taking this medicine or for 7 months after stopping it. Women must use  effective birth control with this medicine. What side effects may I notice from receiving this medication? Side effects that you should report to your doctor or health care professional as soon as possible: allergic reactions like skin rash, itching or hives, swelling of the face, lips, or tongue chest pain or palpitations cough dizziness feeling faint or lightheaded, falls fever general ill feeling or flu-like symptoms signs of worsening heart failure like breathing  problems; swelling in your legs and feet unusually weak or tired Side effects that usually do not require medical attention (report to your doctor or health care professional if they continue or are bothersome): bone pain changes in taste diarrhea joint pain nausea/vomiting weight loss This list may not describe all possible side effects. Call your doctor for medical advice about side effects. You may report side effects to FDA at 1-800-FDA-1088. Where should I keep my medication? This drug is given in a hospital or clinic and will not be stored at home. NOTE: This sheet is a summary. It may not cover all possible information. If you have questions about this medicine, talk to your doctor, pharmacist, or health care provider.  2022 Elsevier/Gold Standard (2016-07-09 00:00:00)

## 2021-09-24 ENCOUNTER — Inpatient Hospital Stay: Payer: Managed Care, Other (non HMO) | Attending: Oncology

## 2021-09-24 ENCOUNTER — Other Ambulatory Visit: Payer: Self-pay

## 2021-09-24 ENCOUNTER — Ambulatory Visit: Payer: Managed Care, Other (non HMO) | Admitting: Oncology

## 2021-09-24 DIAGNOSIS — C50419 Malignant neoplasm of upper-outer quadrant of unspecified female breast: Secondary | ICD-10-CM

## 2021-09-24 DIAGNOSIS — Z170 Estrogen receptor positive status [ER+]: Secondary | ICD-10-CM | POA: Insufficient documentation

## 2021-09-24 DIAGNOSIS — Z5112 Encounter for antineoplastic immunotherapy: Secondary | ICD-10-CM | POA: Insufficient documentation

## 2021-09-24 DIAGNOSIS — Z171 Estrogen receptor negative status [ER-]: Secondary | ICD-10-CM

## 2021-09-24 DIAGNOSIS — C50911 Malignant neoplasm of unspecified site of right female breast: Secondary | ICD-10-CM | POA: Insufficient documentation

## 2021-09-24 LAB — CBC AND DIFFERENTIAL
HCT: 38 (ref 36–46)
Hemoglobin: 12.5 (ref 12.0–16.0)
Neutrophils Absolute: 2.15
Platelets: 184 10*3/uL (ref 150–400)
WBC: 3.9

## 2021-09-24 LAB — BASIC METABOLIC PANEL
BUN: 13 (ref 4–21)
CO2: 28 — AB (ref 13–22)
Chloride: 107 (ref 99–108)
Creatinine: 0.8 (ref 0.5–1.1)
Glucose: 98
Potassium: 3.9 mEq/L (ref 3.5–5.1)
Sodium: 141 (ref 137–147)

## 2021-09-24 LAB — CBC: RBC: 4.34 (ref 3.87–5.11)

## 2021-09-24 LAB — COMPREHENSIVE METABOLIC PANEL
Albumin: 4 (ref 3.5–5.0)
Calcium: 8.8 (ref 8.7–10.7)

## 2021-09-24 LAB — MAGNESIUM: Magnesium: 2

## 2021-09-24 LAB — HEPATIC FUNCTION PANEL
ALT: 24 U/L (ref 7–35)
AST: 23 (ref 13–35)
Alkaline Phosphatase: 65 (ref 25–125)
Bilirubin, Total: 0.5

## 2021-09-25 ENCOUNTER — Inpatient Hospital Stay: Payer: Managed Care, Other (non HMO)

## 2021-09-25 VITALS — BP 130/72 | HR 70 | Temp 98.0°F | Resp 18 | Ht 64.0 in | Wt 192.1 lb

## 2021-09-25 DIAGNOSIS — Z5112 Encounter for antineoplastic immunotherapy: Secondary | ICD-10-CM | POA: Diagnosis not present

## 2021-09-25 DIAGNOSIS — Z170 Estrogen receptor positive status [ER+]: Secondary | ICD-10-CM | POA: Diagnosis not present

## 2021-09-25 DIAGNOSIS — C50911 Malignant neoplasm of unspecified site of right female breast: Secondary | ICD-10-CM | POA: Diagnosis present

## 2021-09-25 DIAGNOSIS — Z171 Estrogen receptor negative status [ER-]: Secondary | ICD-10-CM

## 2021-09-25 MED ORDER — ACETAMINOPHEN 325 MG PO TABS: 650.0000 mg | ORAL_TABLET | Freq: Once | ORAL | Status: DC

## 2021-09-25 MED ORDER — HEPARIN SOD (PORK) LOCK FLUSH 100 UNIT/ML IV SOLN
500.0000 [IU] | Freq: Once | INTRAVENOUS | Status: AC | PRN
  Administered 2021-09-25: 500 [IU]

## 2021-09-25 MED ORDER — SODIUM CHLORIDE 0.9 % IV SOLN: Freq: Once | INTRAVENOUS | Status: AC

## 2021-09-25 MED ORDER — DIPHENHYDRAMINE HCL 25 MG PO CAPS: 50.0000 mg | ORAL_CAPSULE | Freq: Once | ORAL | Status: DC

## 2021-09-25 MED ORDER — TRASTUZUMAB-ANNS CHEMO 150 MG IV SOLR
6.0000 mg/kg | Freq: Once | INTRAVENOUS | Status: AC
  Administered 2021-09-25: 525 mg via INTRAVENOUS
  Filled 2021-09-25: qty 25

## 2021-09-25 MED ORDER — SODIUM CHLORIDE 0.9% FLUSH
10.0000 mL | INTRAVENOUS | Status: DC | PRN
  Administered 2021-09-25: 10 mL

## 2021-09-25 MED ORDER — SODIUM CHLORIDE 0.9 % IV SOLN
420.0000 mg | Freq: Once | INTRAVENOUS | Status: AC
  Administered 2021-09-25: 420 mg via INTRAVENOUS
  Filled 2021-09-25: qty 14

## 2021-09-25 NOTE — Patient Instructions (Signed)
Trastuzumab injection for infusion ?What is this medication? ?TRASTUZUMAB (tras TOO zoo mab) is a monoclonal antibody. It is used to treat breast cancer and stomach cancer. ?This medicine may be used for other purposes; ask your health care provider or pharmacist if you have questions. ?COMMON BRAND NAME(S): Herceptin, Herzuma, KANJINTI, Ogivri, Ontruzant, Trazimera ?What should I tell my care team before I take this medication? ?They need to know if you have any of these conditions: ?heart disease ?heart failure ?lung or breathing disease, like asthma ?an unusual or allergic reaction to trastuzumab, benzyl alcohol, or other medications, foods, dyes, or preservatives ?pregnant or trying to get pregnant ?breast-feeding ?How should I use this medication? ?This drug is given as an infusion into a vein. It is administered in a hospital or clinic by a specially trained health care professional. ?Talk to your pediatrician regarding the use of this medicine in children. This medicine is not approved for use in children. ?Overdosage: If you think you have taken too much of this medicine contact a poison control center or emergency room at once. ?NOTE: This medicine is only for you. Do not share this medicine with others. ?What if I miss a dose? ?It is important not to miss a dose. Call your doctor or health care professional if you are unable to keep an appointment. ?What may interact with this medication? ?This medicine may interact with the following medications: ?certain types of chemotherapy, such as daunorubicin, doxorubicin, epirubicin, and idarubicin ?This list may not describe all possible interactions. Give your health care provider a list of all the medicines, herbs, non-prescription drugs, or dietary supplements you use. Also tell them if you smoke, drink alcohol, or use illegal drugs. Some items may interact with your medicine. ?What should I watch for while using this medication? ?Visit your doctor for checks  on your progress. Report any side effects. Continue your course of treatment even though you feel ill unless your doctor tells you to stop. ?Call your doctor or health care professional for advice if you get a fever, chills or sore throat, or other symptoms of a cold or flu. Do not treat yourself. Try to avoid being around people who are sick. ?You may experience fever, chills and shaking during your first infusion. These effects are usually mild and can be treated with other medicines. Report any side effects during the infusion to your health care professional. Fever and chills usually do not happen with later infusions. ?Do not become pregnant while taking this medicine or for 7 months after stopping it. Women should inform their doctor if they wish to become pregnant or think they might be pregnant. Women of child-bearing potential will need to have a negative pregnancy test before starting this medicine. There is a potential for serious side effects to an unborn child. Talk to your health care professional or pharmacist for more information. Do not breast-feed an infant while taking this medicine or for 7 months after stopping it. ?Women must use effective birth control with this medicine. ?What side effects may I notice from receiving this medication? ?Side effects that you should report to your doctor or health care professional as soon as possible: ?allergic reactions like skin rash, itching or hives, swelling of the face, lips, or tongue ?chest pain or palpitations ?cough ?dizziness ?feeling faint or lightheaded, falls ?fever ?general ill feeling or flu-like symptoms ?signs of worsening heart failure like breathing problems; swelling in your legs and feet ?unusually weak or tired ?Side effects that usually   do not require medical attention (report to your doctor or health care professional if they continue or are bothersome): ?bone pain ?changes in taste ?diarrhea ?joint pain ?nausea/vomiting ?weight  loss ?This list may not describe all possible side effects. Call your doctor for medical advice about side effects. You may report side effects to FDA at 1-800-FDA-1088. ?Where should I keep my medication? ?This drug is given in a hospital or clinic and will not be stored at home. ?NOTE: This sheet is a summary. It may not cover all possible information. If you have questions about this medicine, talk to your doctor, pharmacist, or health care provider. ?? 2022 [employer]vier/Gold Standard (2016-07-09 00:00:00) ?Pertuzumab injection ?What is this medication? ?PERTUZUMAB (per TOOZ ue mab) is a monoclonal antibody. It is used to treat breast cancer. ?This medicine may be used for other purposes; ask your health care provider or pharmacist if you have questions. ?COMMON BRAND NAME(S): PERJETA ?What should I tell my care team before I take this medication? ?They need to know if you have any of these conditions: ?heart disease ?heart failure ?high blood pressure ?history of irregular heart beat ?recent or ongoing radiation therapy ?an unusual or allergic reaction to pertuzumab, other medicines, foods, dyes, or preservatives ?pregnant or trying to get pregnant ?breast-feeding ?How should I use this medication? ?This medicine is for infusion into a vein. It is given by a health care professional in a hospital or clinic setting. ?Talk to your pediatrician regarding the use of this medicine in children. Special care may be needed. ?Overdosage: If you think you have taken too much of this medicine contact a poison control center or emergency room at once. ?NOTE: This medicine is only for you. Do not share this medicine with others. ?What if I miss a dose? ?It is important not to miss your dose. Call your doctor or health care professional if you are unable to keep an appointment. ?What may interact with this medication? ?Interactions are not expected. ?Give your health care provider a list of all the medicines, herbs,  non-prescription drugs, or dietary supplements you use. Also tell them if you smoke, drink alcohol, or use illegal drugs. Some items may interact with your medicine. ?This list may not describe all possible interactions. Give your health care provider a list of all the medicines, herbs, non-prescription drugs, or dietary supplements you use. Also tell them if you smoke, drink alcohol, or use illegal drugs. Some items may interact with your medicine. ?What should I watch for while using this medication? ?Your condition will be monitored carefully while you are receiving this medicine. Report any side effects. Continue your course of treatment even though you feel ill unless your doctor tells you to stop. ?Do not become pregnant while taking this medicine or for 7 months after stopping it. Women should inform their doctor if they wish to become pregnant or think they might be pregnant. Women of child-bearing potential will need to have a negative pregnancy test before starting this medicine. There is a potential for serious side effects to an unborn child. Talk to your health care professional or pharmacist for more information. Do not breast-feed an infant while taking this medicine or for 7 months after stopping it. ?Women must use effective birth control with this medicine. ?Call your doctor or health care professional for advice if you get a fever, chills or sore throat, or other symptoms of a cold or flu. Do not treat yourself. Try to avoid being around people who  are sick. ?You may experience fever, chills, and headache during the infusion. Report any side effects during the infusion to your health care professional. ?What side effects may I notice from receiving this medication? ?Side effects that you should report to your doctor or health care professional as soon as possible: ?breathing problems ?chest pain or palpitations ?dizziness ?feeling faint or lightheaded ?fever or chills ?skin rash, itching or  hives ?sore throat ?swelling of the face, lips, or tongue ?swelling of the legs or ankles ?unusually weak or tired ?Side effects that usually do not require medical attention (report to your doctor or health care professional i

## 2021-09-27 ENCOUNTER — Other Ambulatory Visit: Payer: Self-pay | Admitting: Surgery

## 2021-09-27 DIAGNOSIS — R921 Mammographic calcification found on diagnostic imaging of breast: Secondary | ICD-10-CM

## 2021-10-09 ENCOUNTER — Encounter: Payer: Self-pay | Admitting: Oncology

## 2021-10-11 ENCOUNTER — Ambulatory Visit
Admission: RE | Admit: 2021-10-11 | Discharge: 2021-10-11 | Disposition: A | Discharge status: Home / Self Care | Payer: Managed Care, Other (non HMO) | Source: Ambulatory Visit | Attending: Surgery | Admitting: Surgery

## 2021-10-11 ENCOUNTER — Other Ambulatory Visit: Payer: Self-pay | Admitting: Surgery

## 2021-10-11 DIAGNOSIS — R921 Mammographic calcification found on diagnostic imaging of breast: Secondary | ICD-10-CM

## 2021-10-11 IMAGING — MG MM BREAST BX W/ LOC DEV 1ST LESION IMAGE BX SPEC STEREO GUIDE*R*
7 of 10 series · 7 of 18 positions shown · non-contrast
Comparison: Previous exams.
COMPARISON: Previous exams.

Addendum:
CLINICAL DATA: Patient with indeterminate right breast
calcifications.

EXAM:
RIGHT BREAST STEREOTACTIC CORE NEEDLE BIOPSY

[R (1 of 6)]
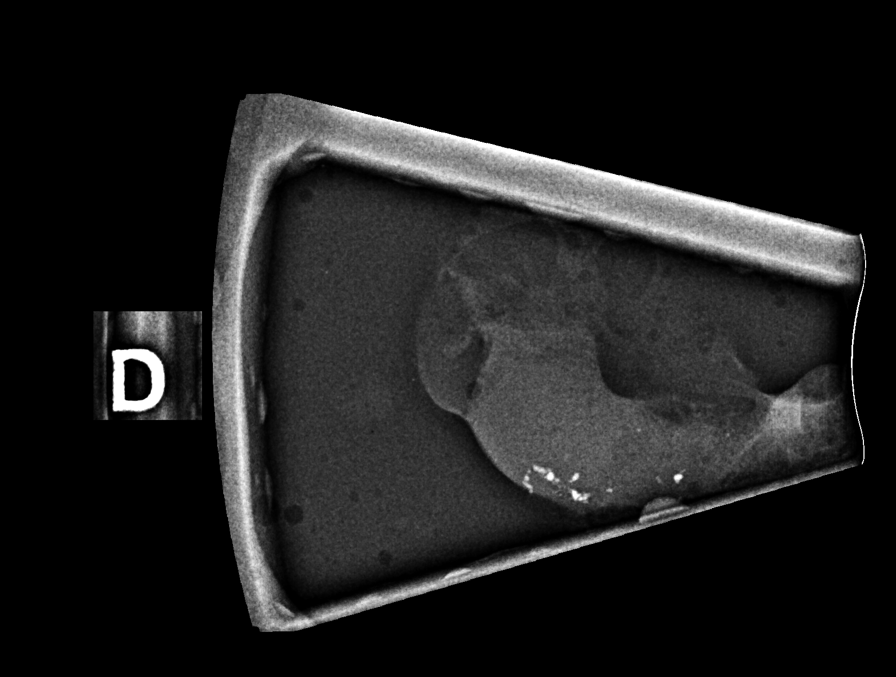

[R (2 of 6)]
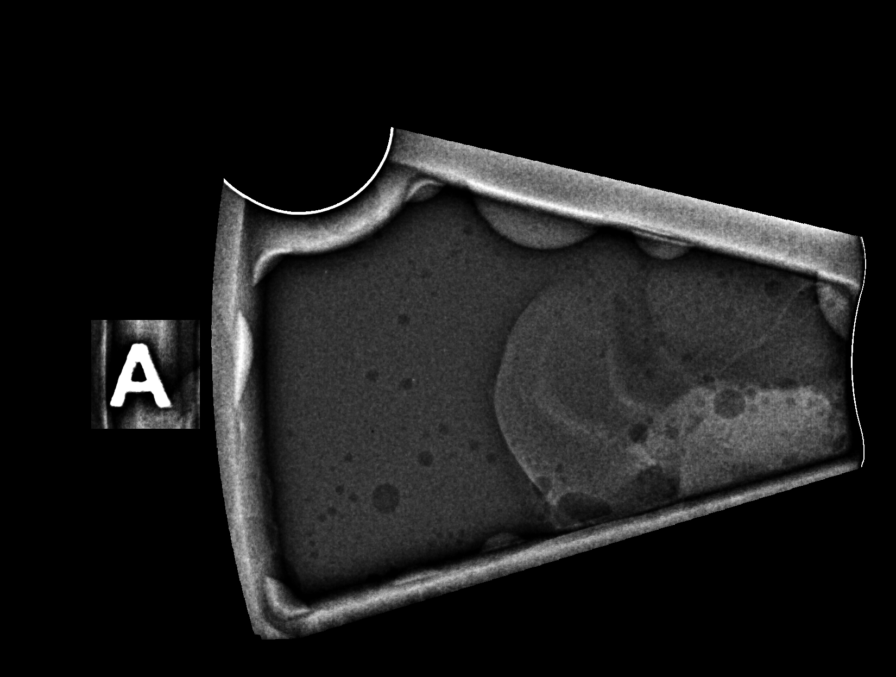

[R (3 of 6)]
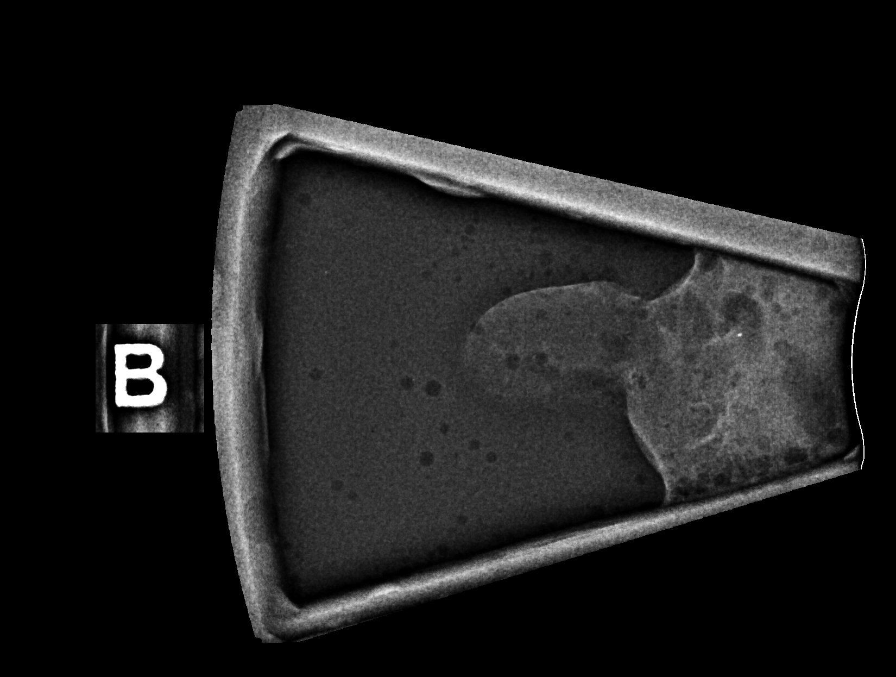

[R (4 of 6)]
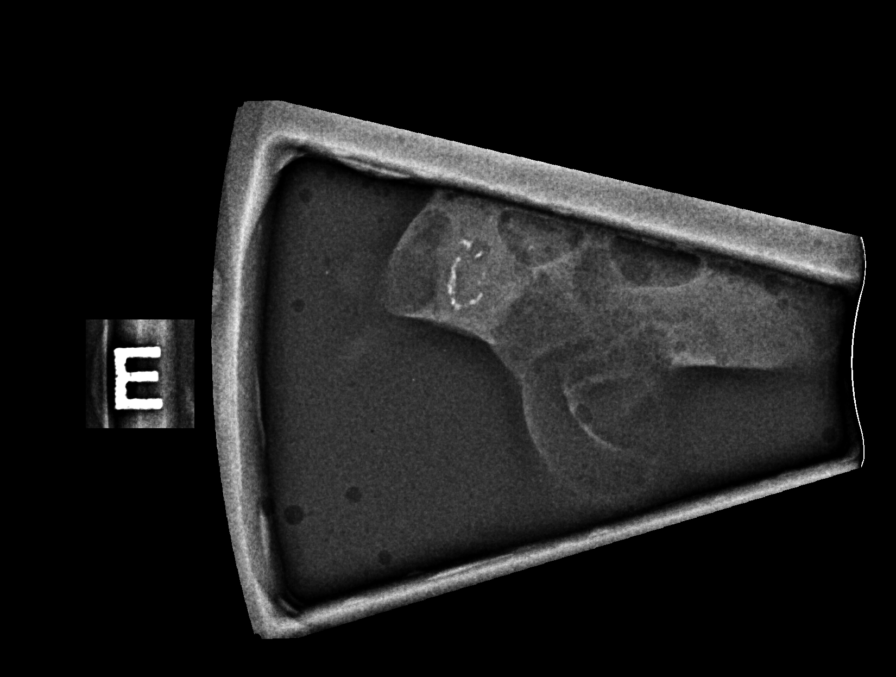

[R (5 of 6)]
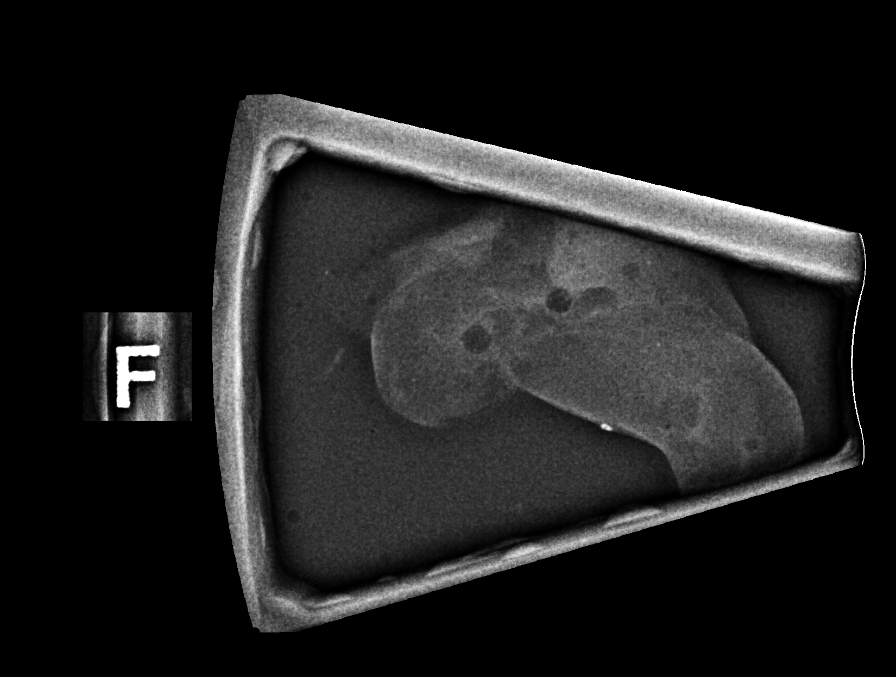

[R (6 of 6)]
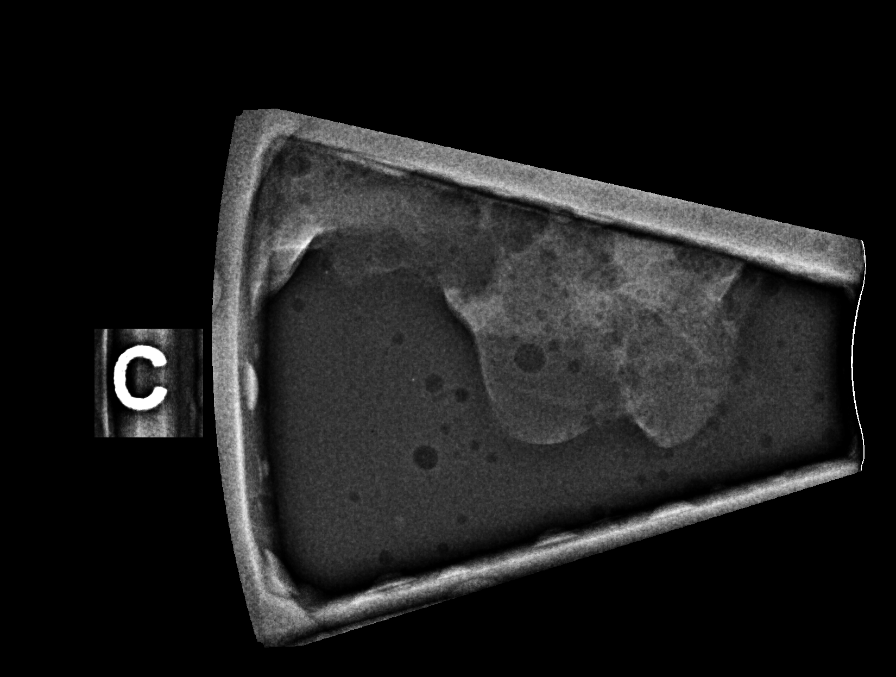

[R LM]
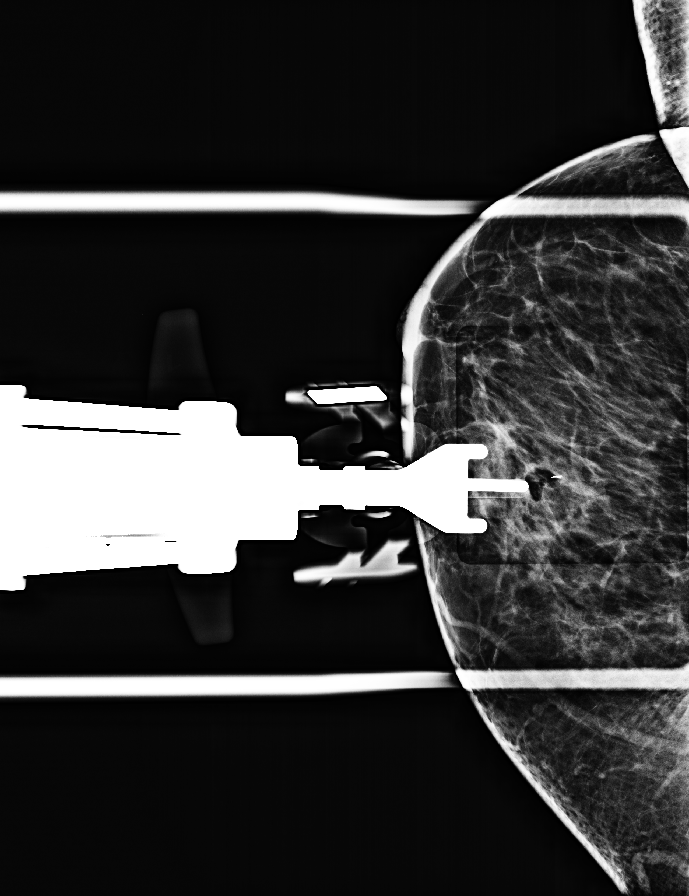

[7 of 18 positions shown; findings below may reference images not displayed]



Site 1: Outer superior

Using sterile technique and 1% Lidocaine as local anesthetic, under
stereotactic guidance, a 9 gauge vacuum assisted device was used to
perform core needle biopsy of calcifications within the outer
superior right breast using a lateral approach. Specimen radiograph
was performed showing calcifications. Specimens with calcifications
are identified for pathology.

Lesion quadrant: Upper outer quadrant

At the conclusion of the procedure, X shaped tissue marker clip was
deployed into the biopsy cavity. Follow-up 2-view mammogram was
performed and dictated separately.

Site 2: Outer inferior

Using sterile technique and 1% Lidocaine as local anesthetic, under
stereotactic guidance, a 9 gauge vacuum assisted device was used to
perform core needle biopsy of calcifications within the outer
inferior right breast using a lateral approach. Specimen radiograph
was performed showing calcifications. Specimens with calcifications
are identified for pathology.

Lesion quadrant: Lower outer quadrant

At the conclusion of the procedure, coil shaped tissue marker clip
was deployed into the biopsy cavity. Follow-up 2-view mammogram was
performed and dictated separately.
IMPRESSION: Stereotactic-guided biopsy of right breast two sites as above. No
apparent complications.

ADDENDUM:
Pathology revealed HIGH-GRADE DUCTAL CARCINOMA IN SITU, SOLID TYPE
WITHOUT NECROSIS - NEGATIVE FOR INVASIVE CARCINOMA-

MICROCALCIFICATIONS PRESENT WITHIN DCIS AND BENIGN DUCTS AND STROMA-
BACKGROUND FIBROCYSTIC CHANGES INCLUDING EXTENSIVE STROMAL FIBROSIS
of the RIGHT breast, outer superior (X clip). This was found to be
concordant by Dr. SEARE.

Pathology revealed FOCAL HIGH-GRADE DUCTAL CARCINOMA IN SITU, SOLID
TYPE AND FRAGMENT OF NECROTIC DEBRIS- NEGATIVE FOR INVASIVE
CARCINOMA. MICROCALCIFICATION PRESENT WITHIN DCIS AND NECROTIC
DEBRIS. BACKGROUND FIBROCYSTIC CHANGES INCLUDING EXTENSIVE STROMAL
FIBROSIS of the RIGHT breast, outer inferior (coil clip). This was
found to be concordant by Dr. SEARE.

Pathology results were discussed with the patient by telephone. The
patient reported doing well after the biopsies with tenderness at
the sites. Post biopsy instructions and care were reviewed and
questions were answered. The patient was encouraged to call The

Consider breast MRI given high grade histology.

Per patient, she is scheduled for follow up with Dr. SEARE of
[facility] (reports faxed to his office [DATE]) and Dr. SEARE
[facility] of [facility] on [DATE].

Pathology results reported by SEARE RN on [DATE].



Site 1: Outer superior

Using sterile technique and 1% Lidocaine as local anesthetic, under
stereotactic guidance, a 9 gauge vacuum assisted device was used to
perform core needle biopsy of calcifications within the outer
superior right breast using a lateral approach. Specimen radiograph
was performed showing calcifications. Specimens with calcifications
are identified for pathology.

Lesion quadrant: Upper outer quadrant

At the conclusion of the procedure, X shaped tissue marker clip was
deployed into the biopsy cavity. Follow-up 2-view mammogram was
performed and dictated separately.

Site 2: Outer inferior

Using sterile technique and 1% Lidocaine as local anesthetic, under
stereotactic guidance, a 9 gauge vacuum assisted device was used to
perform core needle biopsy of calcifications within the outer
inferior right breast using a lateral approach. Specimen radiograph
was performed showing calcifications. Specimens with calcifications
are identified for pathology.

Lesion quadrant: Lower outer quadrant

At the conclusion of the procedure, coil shaped tissue marker clip
was deployed into the biopsy cavity. Follow-up 2-view mammogram was
performed and dictated separately.
IMPRESSION: Stereotactic-guided biopsy of right breast two sites as above. No
apparent complications.

## 2021-10-11 IMAGING — MG MM BREAST LOCALIZATION CLIP
4 series · 4 of 12 positions shown · non-contrast
Comparison: Previous exam(s).

CLINICAL DATA: Patient status post stereo biopsy right breast
calcifications, 2 sites

EXAM:
3D DIAGNOSTIC RIGHT MAMMOGRAM POST STEREOTACTIC BIOPSY

[R CC synth-2D]
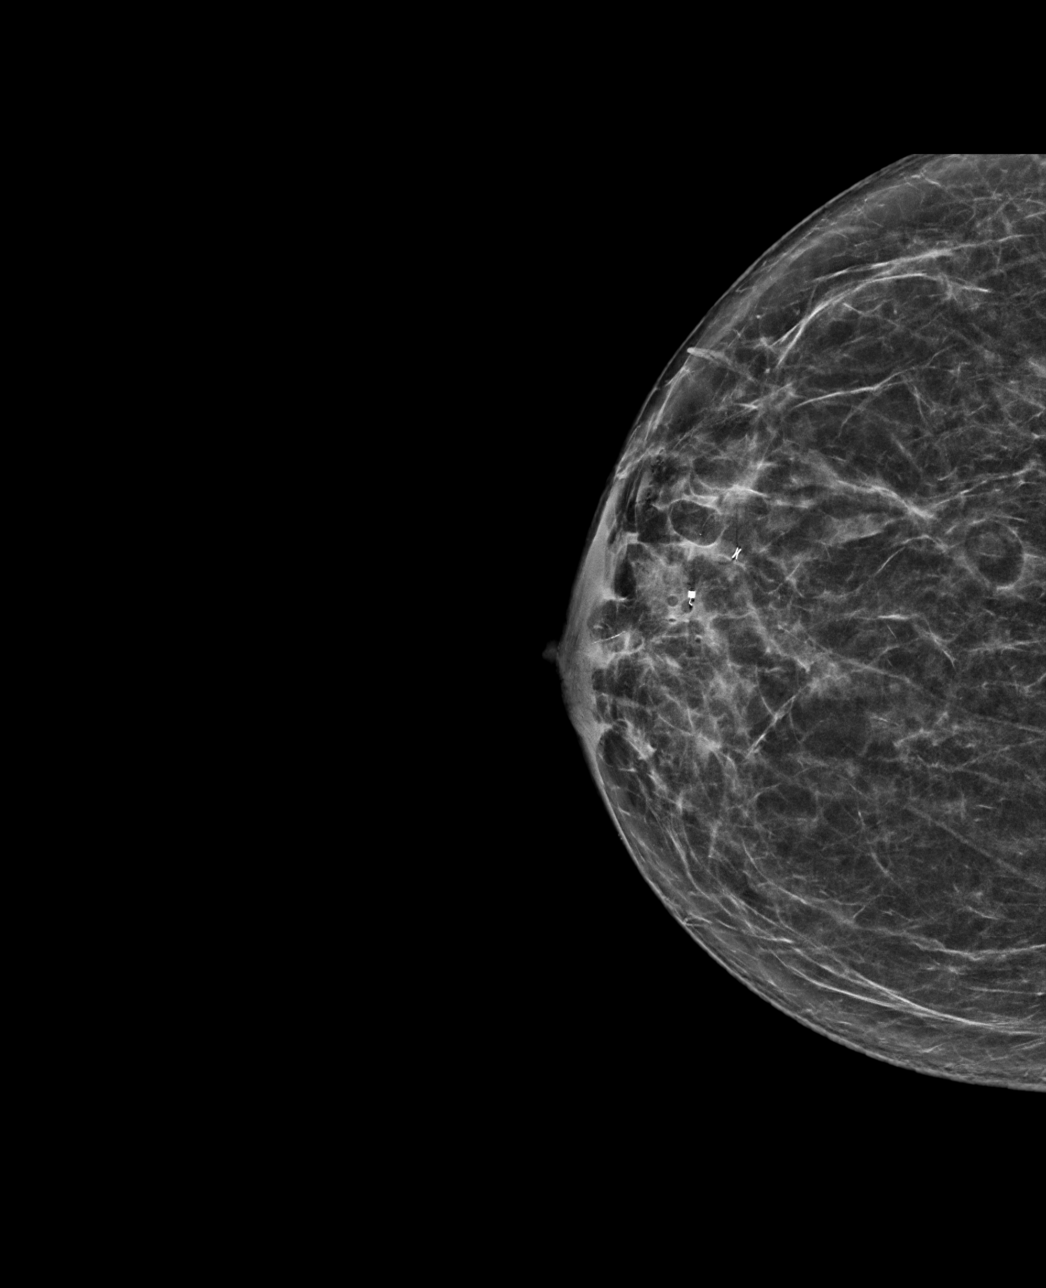

[R LM synth-2D]
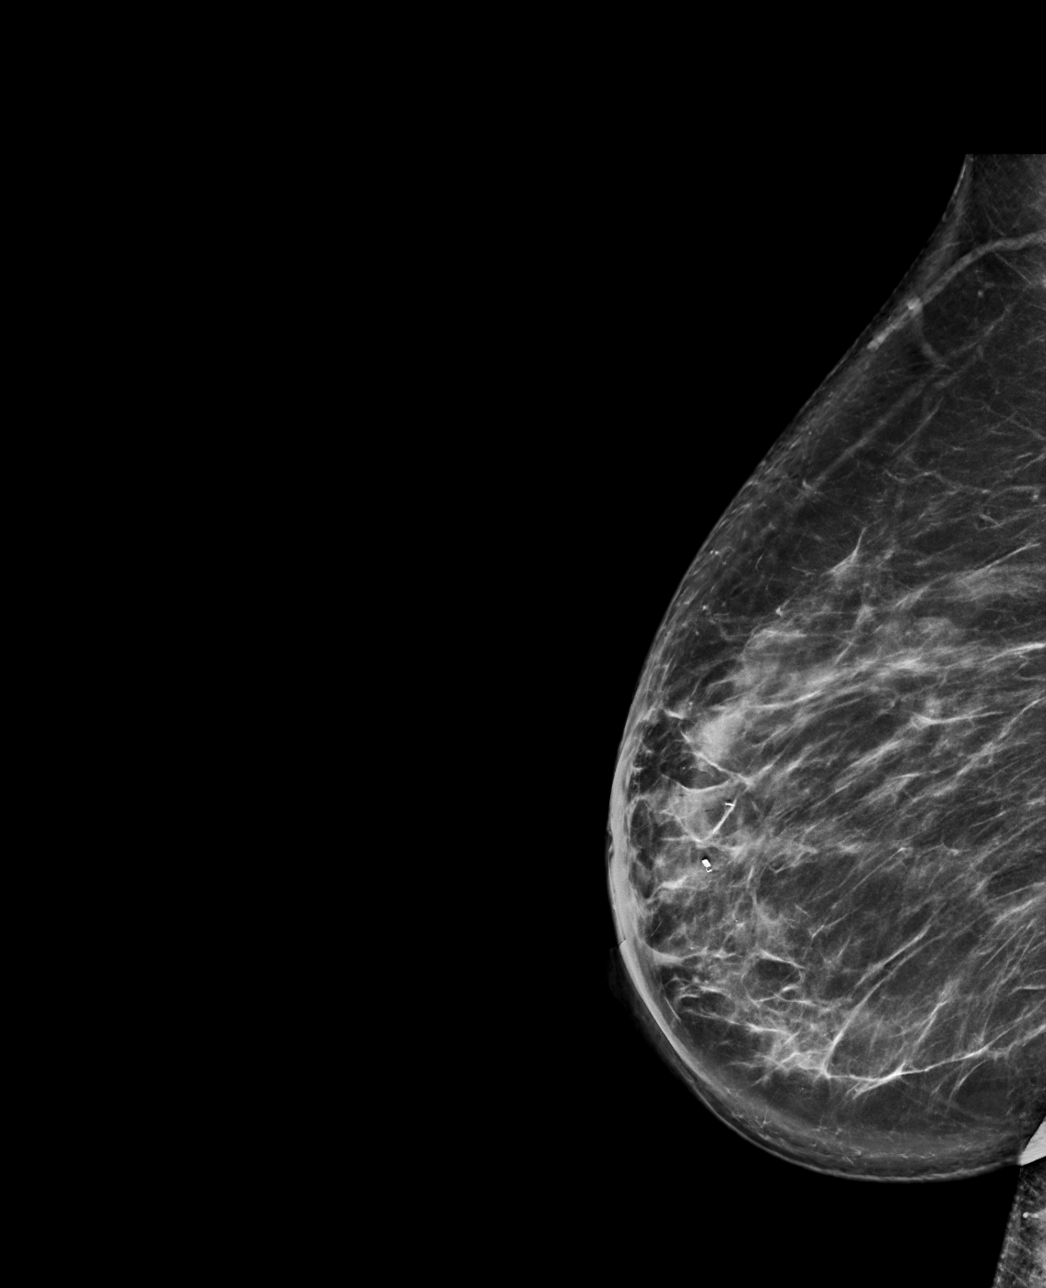

[R CC tomo · tomo slice 37/73.0]
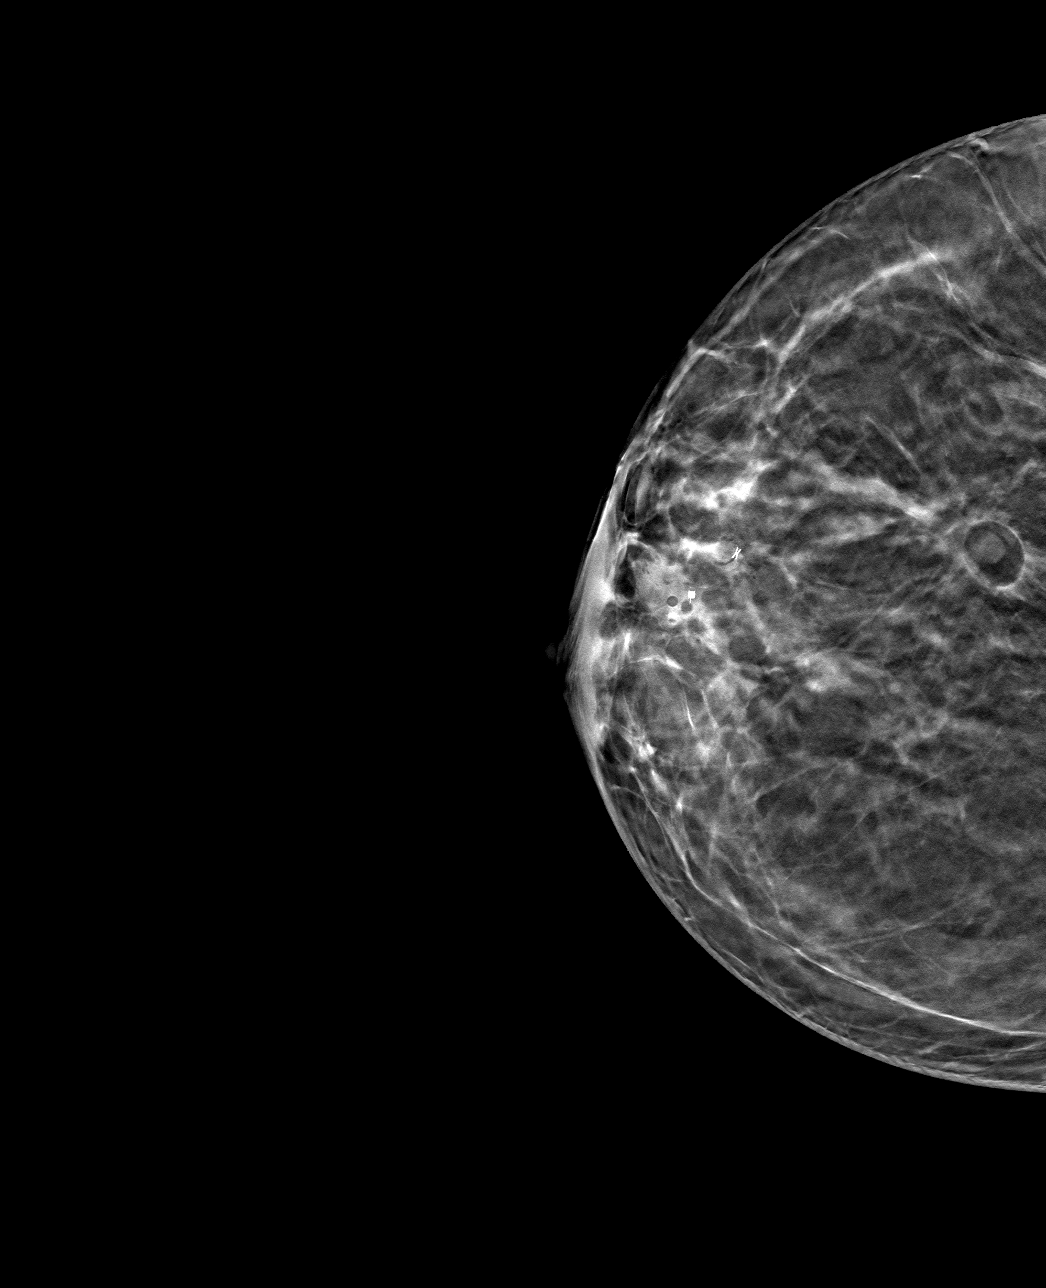

[R LM tomo · tomo slice 44/87.0]
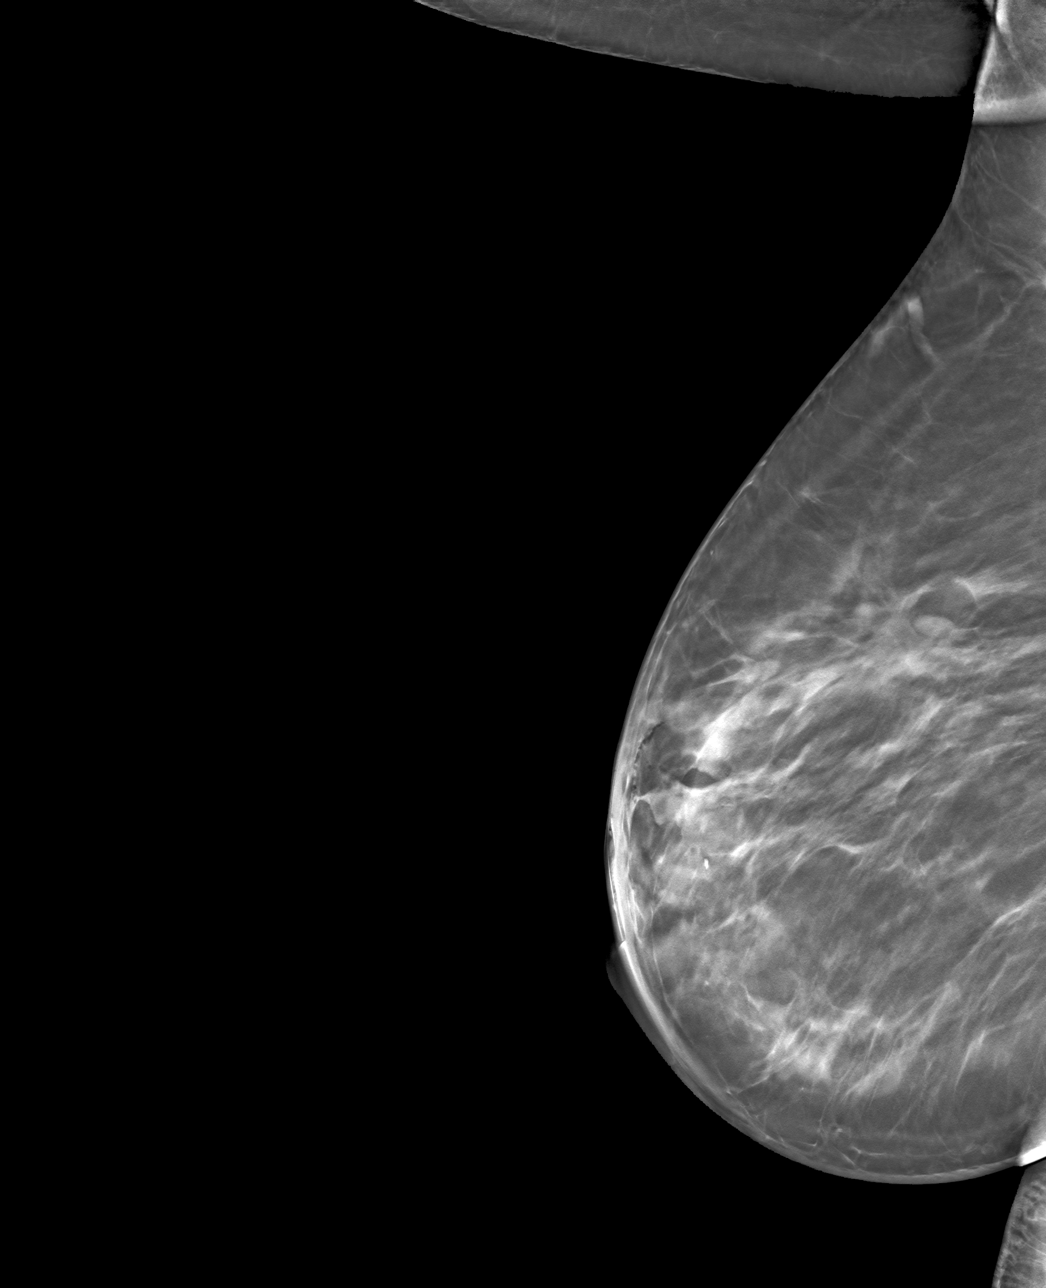

[4 of 12 positions shown; findings below may reference images not displayed]

FINDINGS: 3D Mammographic images were obtained following stereotactic guided
biopsy of right breast 2 sites.

Site 1: Outer superior: X shaped clip: In appropriate position.

Site 2: Outer inferior: Coil shaped clip: In appropriate position
IMPRESSION: Appropriate positioning of the biopsy marking clips as above.

Final Assessment: Post Procedure Mammograms for Marker Placement

## 2021-10-11 IMAGING — MG MM BREAST BX W/ LOC DEV EA AD LESION IMAG BX SPEC STEREO GUIDE*R
7 of 9 series · 7 of 21 positions shown · non-contrast
Comparison: Previous exams.
COMPARISON: Previous exams.

Addendum:
CLINICAL DATA: Patient with indeterminate right breast
calcifications.

EXAM:
RIGHT BREAST STEREOTACTIC CORE NEEDLE BIOPSY

[R (1 of 5)]
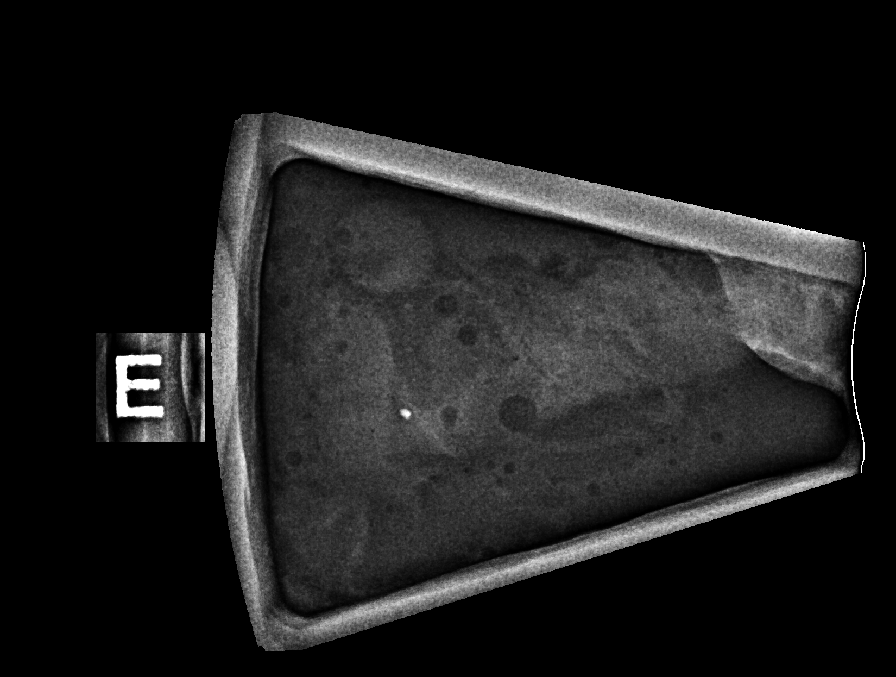

[R (2 of 5)]
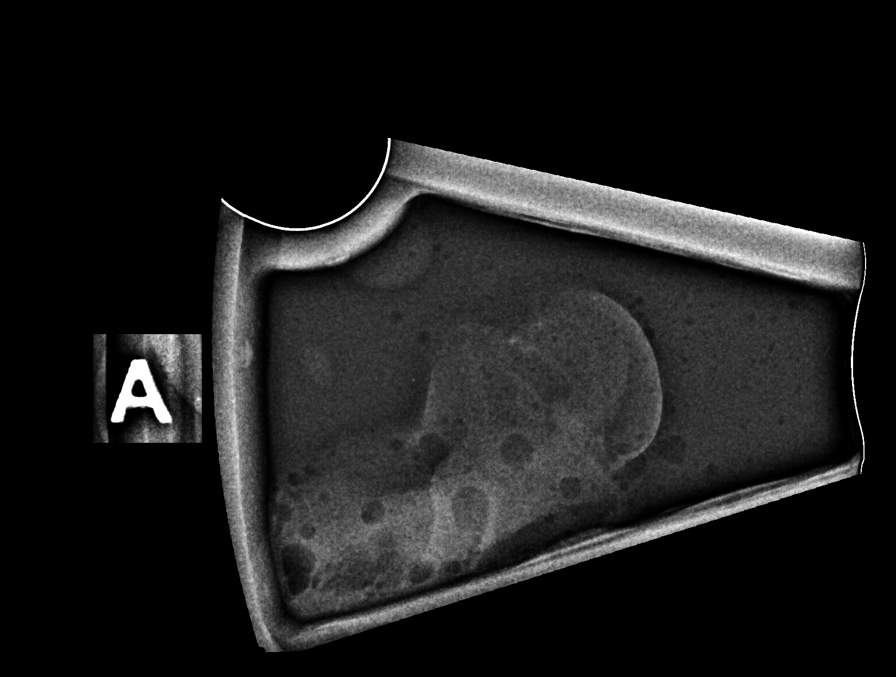

[R (3 of 5)]
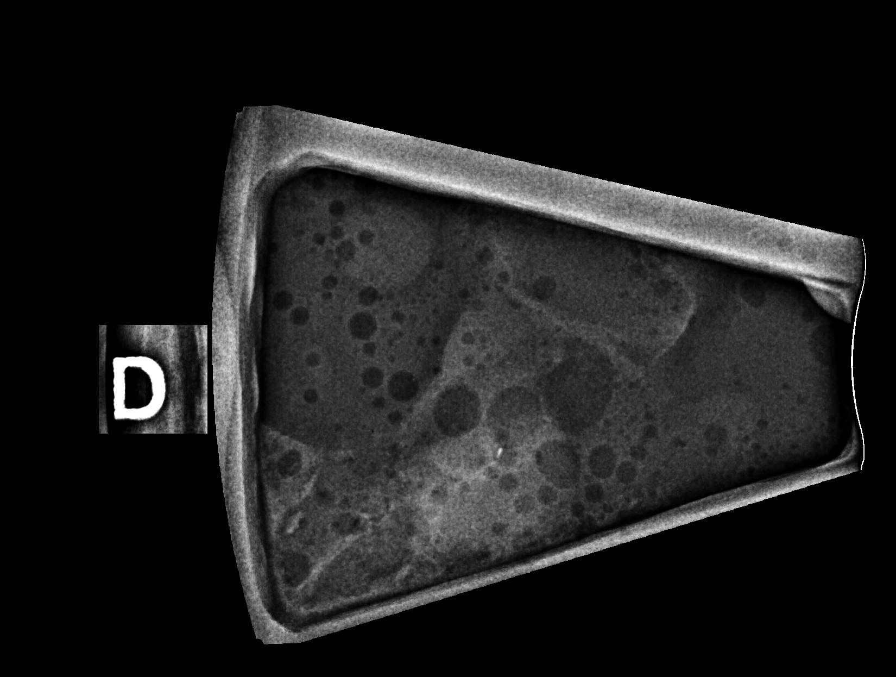

[R (4 of 5)]
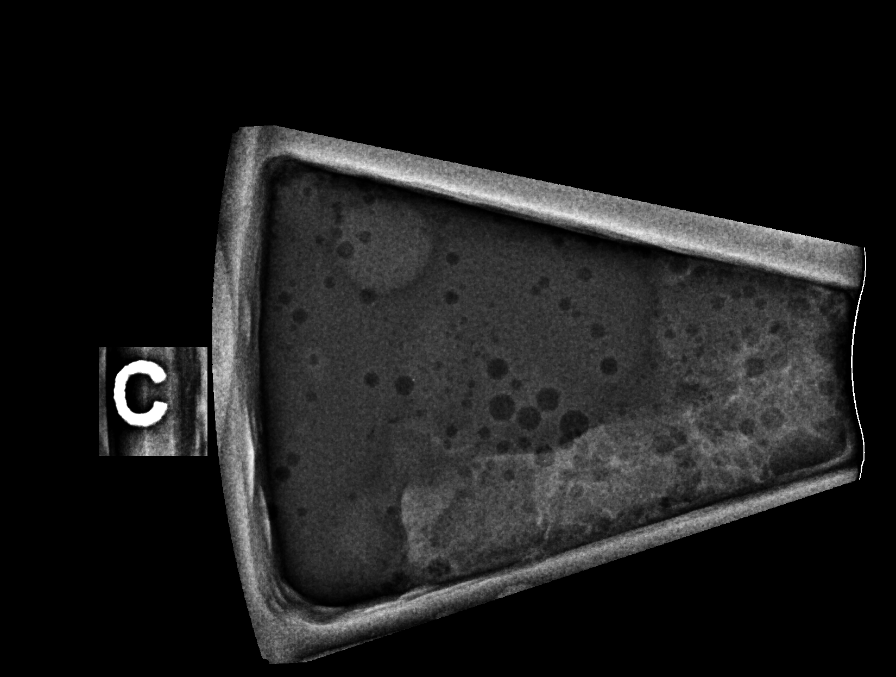

[R (5 of 5)]
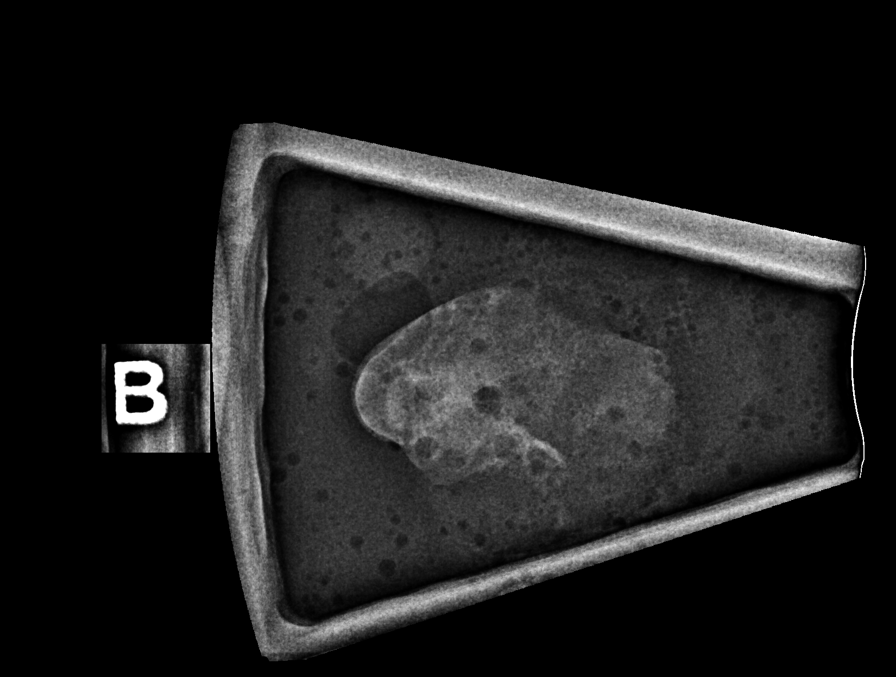

[R LM]
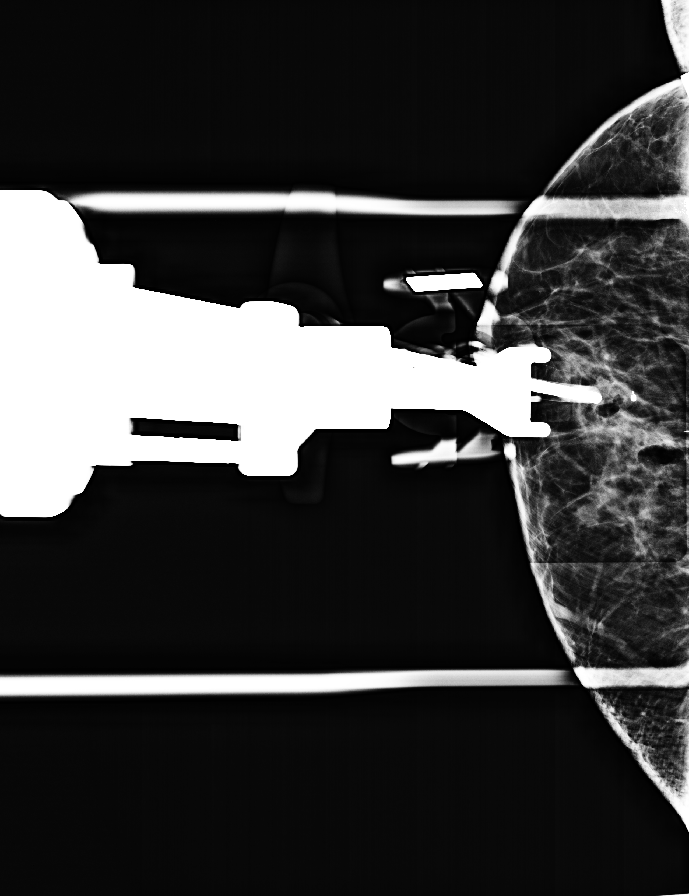

[R LM tomo · tomo slice 35/68.0]
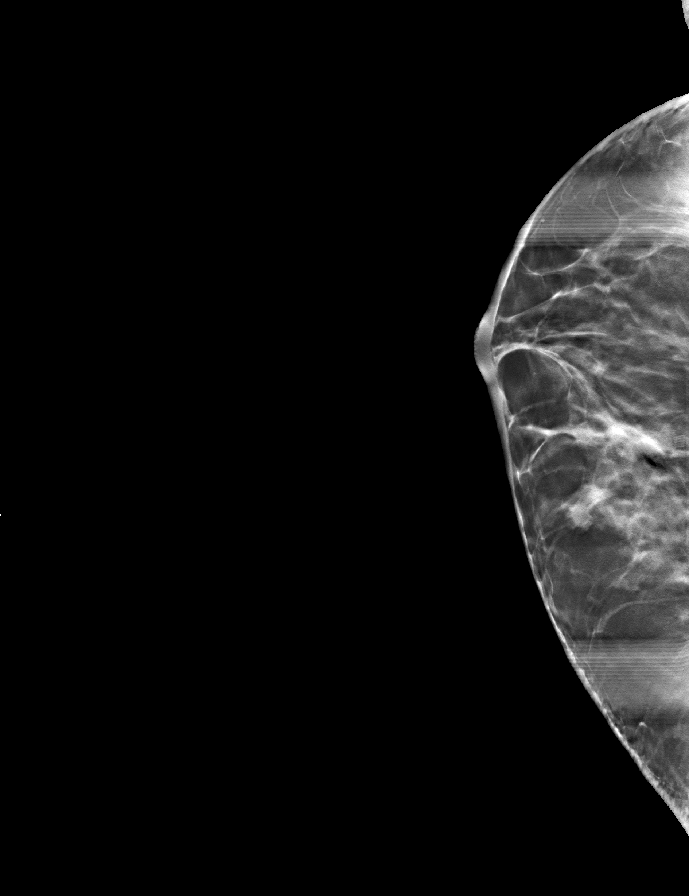

[7 of 21 positions shown; findings below may reference images not displayed]



Site 1: Outer superior

Using sterile technique and 1% Lidocaine as local anesthetic, under
stereotactic guidance, a 9 gauge vacuum assisted device was used to
perform core needle biopsy of calcifications within the outer
superior right breast using a lateral approach. Specimen radiograph
was performed showing calcifications. Specimens with calcifications
are identified for pathology.

Lesion quadrant: Upper outer quadrant

At the conclusion of the procedure, X shaped tissue marker clip was
deployed into the biopsy cavity. Follow-up 2-view mammogram was
performed and dictated separately.

Site 2: Outer inferior

Using sterile technique and 1% Lidocaine as local anesthetic, under
stereotactic guidance, a 9 gauge vacuum assisted device was used to
perform core needle biopsy of calcifications within the outer
inferior right breast using a lateral approach. Specimen radiograph
was performed showing calcifications. Specimens with calcifications
are identified for pathology.

Lesion quadrant: Lower outer quadrant

At the conclusion of the procedure, coil shaped tissue marker clip
was deployed into the biopsy cavity. Follow-up 2-view mammogram was
performed and dictated separately.
IMPRESSION: Stereotactic-guided biopsy of right breast two sites as above. No
apparent complications.

ADDENDUM:
Pathology revealed HIGH-GRADE DUCTAL CARCINOMA IN SITU, SOLID TYPE
WITHOUT NECROSIS - NEGATIVE FOR INVASIVE CARCINOMA-

MICROCALCIFICATIONS PRESENT WITHIN DCIS AND BENIGN DUCTS AND STROMA-
BACKGROUND FIBROCYSTIC CHANGES INCLUDING EXTENSIVE STROMAL FIBROSIS
of the RIGHT breast, outer superior (X clip). This was found to be
concordant by Dr. SEARE.

Pathology revealed FOCAL HIGH-GRADE DUCTAL CARCINOMA IN SITU, SOLID
TYPE AND FRAGMENT OF NECROTIC DEBRIS- NEGATIVE FOR INVASIVE
CARCINOMA. MICROCALCIFICATION PRESENT WITHIN DCIS AND NECROTIC
DEBRIS. BACKGROUND FIBROCYSTIC CHANGES INCLUDING EXTENSIVE STROMAL
FIBROSIS of the RIGHT breast, outer inferior (coil clip). This was
found to be concordant by Dr. SEARE.

Pathology results were discussed with the patient by telephone. The
patient reported doing well after the biopsies with tenderness at
the sites. Post biopsy instructions and care were reviewed and
questions were answered. The patient was encouraged to call The

Consider breast MRI given high grade histology.

Per patient, she is scheduled for follow up with Dr. SEARE of
[facility] (reports faxed to his office [DATE]) and Dr. SEARE
[facility] of [facility] on [DATE].

Pathology results reported by SEARE RN on [DATE].



Site 1: Outer superior

Using sterile technique and 1% Lidocaine as local anesthetic, under
stereotactic guidance, a 9 gauge vacuum assisted device was used to
perform core needle biopsy of calcifications within the outer
superior right breast using a lateral approach. Specimen radiograph
was performed showing calcifications. Specimens with calcifications
are identified for pathology.

Lesion quadrant: Upper outer quadrant

At the conclusion of the procedure, X shaped tissue marker clip was
deployed into the biopsy cavity. Follow-up 2-view mammogram was
performed and dictated separately.

Site 2: Outer inferior

Using sterile technique and 1% Lidocaine as local anesthetic, under
stereotactic guidance, a 9 gauge vacuum assisted device was used to
perform core needle biopsy of calcifications within the outer
inferior right breast using a lateral approach. Specimen radiograph
was performed showing calcifications. Specimens with calcifications
are identified for pathology.

Lesion quadrant: Lower outer quadrant

At the conclusion of the procedure, coil shaped tissue marker clip
was deployed into the biopsy cavity. Follow-up 2-view mammogram was
performed and dictated separately.
IMPRESSION: Stereotactic-guided biopsy of right breast two sites as above. No
apparent complications.

## 2021-10-14 NOTE — Progress Notes (Signed)
?[facility]  ?[address] ?[place],  [place]  [address] ?[phone] ? ?Clinic Day:  10/15/2021 ? ?Referring physician: Orrin Brigham, MD ? ?HISTORY OF PRESENT ILLNESS:  ?The patient is a 61 y.o. 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  She is taking adjuvant Perjeta/Herceptin, which is being given once every 3 weeks for a full year.  She presents today prior to her 14th cycle of adjuvant therapy. She tolerated her 13th cycle without having any significant difficulty.  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  She is scheduled to see surgery later this week to discuss the next breast surgery she will likely need.  As her DCIS is in her right breast, which is the same breast where her invasive ductal carcinoma was removed, the concern is she may need a mastectomy.  However, the patient is not particularly enamored with undergoing a completion mastectomy for DCIS recurrence.  However, what does have her concerned is that her initial right breast biopsy last year showed DCIS, only for her lumpectomy to reveal an 8 mm focus of invasive ductal carcinoma.   ? ?PHYSICAL EXAM:  ?Blood pressure 130/76, pulse 76, temperature 98.5 ?F (36.9 ?C), resp. rate 16, height _0  (1.626 m), weight 189 lb 3.2 oz (85.8 kg), last menstrual period 10/25/2015, SpO2 96 %. ?Wt Readings from Last 3 Encounters:  ?10/15/21 189 lb 3.2 oz (85.8 kg)  ?09/25/21 192 lb 1.3 oz (87.1 kg)  ?09/03/21 191 lb 1.6 oz (86.7 kg)  ? ?Body mass index is 32.48 kg/m?Marland Kitchen ?Performance status (ECOG): 0 - Asymptomatic ?Physical Exam ?Constitutional:   ?   General: She is not in acute distress. ?   Appearance: Normal appearance. She is normal weight.  ?HENT:  ?   Head: Normocephalic and atraumatic.  ?   Mouth/Throat:  ?   Pharynx: Oropharynx is clear. No oropharyngeal exudate.  ?Eyes:  ?   General: No scleral icterus. ?   Extraocular  Movements: Extraocular movements intact.  ?   Conjunctiva/sclera: Conjunctivae normal.  ?   Pupils: Pupils are equal, round, and reactive to light.  ?Cardiovascular:  ?   Rate and Rhythm: Normal rate and regular rhythm.  ?   Pulses: Normal pulses.  ?   Heart sounds: Normal heart sounds. No murmur heard. ?  No friction rub. No gallop.  ?Pulmonary:  ?   Effort: Pulmonary effort is normal. No respiratory distress.  ?   Breath sounds: Normal breath sounds.  ?Chest:  ?Breasts: ?   Right: No swelling, bleeding, inverted nipple, mass, nipple discharge or skin change.  ?   Left: No swelling, bleeding, inverted nipple, mass, nipple discharge or skin change.  ?Abdominal:  ?   General: Bowel sounds are normal. There is no distension.  ?   Palpations: Abdomen is soft. There is no hepatomegaly, splenomegaly or mass.  ?   Tenderness: There is no abdominal tenderness.  ?Musculoskeletal:     ?   General: No tenderness. Normal range of motion.  ?   Cervical back: Normal range of motion and neck supple.  ?   Right lower leg: No edema.  ?   Left lower leg: No edema.  ?Lymphadenopathy:  ?   Cervical: No cervical adenopathy.  ?   Right cervical: No superficial, deep or posterior cervical adenopathy. ?   Left cervical: No superficial, deep or posterior cervical adenopathy.  ?  Upper Body:  ?   Right upper body: No supraclavicular or axillary adenopathy.  ?   Left upper body: No supraclavicular or axillary adenopathy.  ?   Lower Body: No right inguinal adenopathy. No left inguinal adenopathy.  ?Skin: ?   General: Skin is warm and dry.  ?   Coloration: Skin is not jaundiced.  ?   Findings: No lesion or rash.  ?Neurological:  ?   General: No focal deficit present.  ?   Mental Status: She is alert and oriented to person, place, and time. Mental status is at baseline.  ?Psychiatric:     ?   Mood and Affect: Mood normal.     ?   Behavior: Behavior normal.     ?   Thought Content: Thought content normal.     ?   Judgment: Judgment normal.   ? ? ?ASSESSMENT & PLAN:  ?A 61 y.o. 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  She will proceed with cycle 14 of Herceptin/Perjeta tomorrow.  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  She is scheduled to see surgery this week to discuss what intervention may be necessary.  As she appears to be more interested in breast conservation, I told her that it would be imperative for her to undergo a breast MRI to better to see all the architecture within her right breast.  Usually a mastectomy would be considered when cancer has recurred in the same breast.  If she wishes to do a lumpectomy, then I want her breast MRI images to show no other abnormal foci of disease present that would preclude her from undergoing breast conservation surgery.  I will speak with her surgeon about ensuring this study gets done before a surgical decision is made.  Otherwise, I will see this patient back in 6 weeks before she heads into her 16th cycle of maintenance HER2 therapy.  The patient understands all the plans discussed today and knows to contact our office before then if she has additional questions regarding her disease management.   ? ? ?Rosevelt Luu Macarthur Critchley, MD ? ? ? ?  ? ?

## 2021-10-15 ENCOUNTER — Inpatient Hospital Stay: Payer: Managed Care, Other (non HMO) | Attending: Oncology | Admitting: Oncology

## 2021-10-15 ENCOUNTER — Inpatient Hospital Stay: Payer: Managed Care, Other (non HMO)

## 2021-10-15 ENCOUNTER — Other Ambulatory Visit: Payer: Self-pay | Admitting: Pharmacist

## 2021-10-15 VITALS — BP 130/76 | HR 76 | Temp 98.5°F | Resp 16 | Ht 64.0 in | Wt 189.2 lb

## 2021-10-15 DIAGNOSIS — Z171 Estrogen receptor negative status [ER-]: Secondary | ICD-10-CM | POA: Diagnosis not present

## 2021-10-15 DIAGNOSIS — C50911 Malignant neoplasm of unspecified site of right female breast: Secondary | ICD-10-CM | POA: Insufficient documentation

## 2021-10-15 DIAGNOSIS — C50411 Malignant neoplasm of upper-outer quadrant of right female breast: Secondary | ICD-10-CM

## 2021-10-15 DIAGNOSIS — Z170 Estrogen receptor positive status [ER+]: Secondary | ICD-10-CM | POA: Insufficient documentation

## 2021-10-15 DIAGNOSIS — Z5112 Encounter for antineoplastic immunotherapy: Secondary | ICD-10-CM | POA: Insufficient documentation

## 2021-10-15 LAB — BASIC METABOLIC PANEL
BUN: 15 (ref 4–21)
CO2: 26 — AB (ref 13–22)
Chloride: 106 (ref 99–108)
Creatinine: 0.7 (ref 0.5–1.1)
Glucose: 104
Potassium: 3.8 mEq/L (ref 3.5–5.1)
Sodium: 142 (ref 137–147)

## 2021-10-15 LAB — HEPATIC FUNCTION PANEL
ALT: 24 U/L (ref 7–35)
AST: 25 (ref 13–35)
Alkaline Phosphatase: 62 (ref 25–125)
Bilirubin, Total: 0.6

## 2021-10-15 LAB — CBC AND DIFFERENTIAL
HCT: 40 (ref 36–46)
Hemoglobin: 13 (ref 12.0–16.0)
Neutrophils Absolute: 2.55
Platelets: 196 10*3/uL (ref 150–400)
WBC: 4.4

## 2021-10-15 LAB — COMPREHENSIVE METABOLIC PANEL
Albumin: 4.3 (ref 3.5–5.0)
Calcium: 9 (ref 8.7–10.7)

## 2021-10-15 LAB — MAGNESIUM: Magnesium: 2.2

## 2021-10-15 LAB — SURGICAL PATHOLOGY

## 2021-10-15 LAB — CBC: RBC: 4.55 (ref 3.87–5.11)

## 2021-10-16 ENCOUNTER — Encounter: Payer: Self-pay | Admitting: Oncology

## 2021-10-16 ENCOUNTER — Inpatient Hospital Stay: Payer: Managed Care, Other (non HMO)

## 2021-10-16 VITALS — BP 141/74 | HR 78 | Temp 98.3°F | Resp 18 | Ht 64.0 in | Wt 190.0 lb

## 2021-10-16 DIAGNOSIS — Z5112 Encounter for antineoplastic immunotherapy: Secondary | ICD-10-CM | POA: Diagnosis present

## 2021-10-16 DIAGNOSIS — C50911 Malignant neoplasm of unspecified site of right female breast: Secondary | ICD-10-CM | POA: Diagnosis present

## 2021-10-16 DIAGNOSIS — Z170 Estrogen receptor positive status [ER+]: Secondary | ICD-10-CM | POA: Diagnosis not present

## 2021-10-16 DIAGNOSIS — Z171 Estrogen receptor negative status [ER-]: Secondary | ICD-10-CM

## 2021-10-16 MED ORDER — HEPARIN SOD (PORK) LOCK FLUSH 100 UNIT/ML IV SOLN
500.0000 [IU] | Freq: Once | INTRAVENOUS | Status: AC | PRN
  Administered 2021-10-16: 500 [IU]

## 2021-10-16 MED ORDER — TRASTUZUMAB-ANNS CHEMO 150 MG IV SOLR
6.0000 mg/kg | Freq: Once | INTRAVENOUS | Status: AC
  Administered 2021-10-16: 525 mg via INTRAVENOUS
  Filled 2021-10-16: qty 25

## 2021-10-16 MED ORDER — SODIUM CHLORIDE 0.9 % IV SOLN
420.0000 mg | Freq: Once | INTRAVENOUS | Status: AC
  Administered 2021-10-16: 420 mg via INTRAVENOUS
  Filled 2021-10-16: qty 14

## 2021-10-16 MED ORDER — DIPHENHYDRAMINE HCL 25 MG PO CAPS: 50.0000 mg | ORAL_CAPSULE | Freq: Once | ORAL | Status: DC

## 2021-10-16 MED ORDER — SODIUM CHLORIDE 0.9 % IV SOLN: Freq: Once | INTRAVENOUS | Status: AC

## 2021-10-16 MED ORDER — ACETAMINOPHEN 325 MG PO TABS: 650.0000 mg | ORAL_TABLET | Freq: Once | ORAL | Status: DC

## 2021-10-16 MED ORDER — SODIUM CHLORIDE 0.9% FLUSH
10.0000 mL | INTRAVENOUS | Status: DC | PRN
  Administered 2021-10-16: 10 mL

## 2021-10-16 NOTE — Patient Instructions (Signed)
Pertuzumab injection ?What is this medication? ?PERTUZUMAB (per TOOZ ue mab) is a monoclonal antibody. It is used to treat breast cancer. ?This medicine may be used for other purposes; ask your health care provider or pharmacist if you have questions. ?COMMON BRAND NAME(S): PERJETA ?What should I tell my care team before I take this medication? ?They need to know if you have any of these conditions: ?heart disease ?heart failure ?high blood pressure ?history of irregular heart beat ?recent or ongoing radiation therapy ?an unusual or allergic reaction to pertuzumab, other medicines, foods, dyes, or preservatives ?pregnant or trying to get pregnant ?breast-feeding ?How should I use this medication? ?This medicine is for infusion into a vein. It is given by a health care professional in a hospital or clinic setting. ?Talk to your pediatrician regarding the use of this medicine in children. Special care may be needed. ?Overdosage: If you think you have taken too much of this medicine contact a poison control center or emergency room at once. ?NOTE: This medicine is only for you. Do not share this medicine with others. ?What if I miss a dose? ?It is important not to miss your dose. Call your doctor or health care professional if you are unable to keep an appointment. ?What may interact with this medication? ?Interactions are not expected. ?Give your health care provider a list of all the medicines, herbs, non-prescription drugs, or dietary supplements you use. Also tell them if you smoke, drink alcohol, or use illegal drugs. Some items may interact with your medicine. ?This list may not describe all possible interactions. Give your health care provider a list of all the medicines, herbs, non-prescription drugs, or dietary supplements you use. Also tell them if you smoke, drink alcohol, or use illegal drugs. Some items may interact with your medicine. ?What should I watch for while using this medication? ?Your condition  will be monitored carefully while you are receiving this medicine. Report any side effects. Continue your course of treatment even though you feel ill unless your doctor tells you to stop. ?Do not become pregnant while taking this medicine or for 7 months after stopping it. Women should inform their doctor if they wish to become pregnant or think they might be pregnant. Women of child-bearing potential will need to have a negative pregnancy test before starting this medicine. There is a potential for serious side effects to an unborn child. Talk to your health care professional or pharmacist for more information. Do not breast-feed an infant while taking this medicine or for 7 months after stopping it. ?Women must use effective birth control with this medicine. ?Call your doctor or health care professional for advice if you get a fever, chills or sore throat, or other symptoms of a cold or flu. Do not treat yourself. Try to avoid being around people who are sick. ?You may experience fever, chills, and headache during the infusion. Report any side effects during the infusion to your health care professional. ?What side effects may I notice from receiving this medication? ?Side effects that you should report to your doctor or health care professional as soon as possible: ?breathing problems ?chest pain or palpitations ?dizziness ?feeling faint or lightheaded ?fever or chills ?skin rash, itching or hives ?sore throat ?swelling of the face, lips, or tongue ?swelling of the legs or ankles ?unusually weak or tired ?Side effects that usually do not require medical attention (report to your doctor or health care professional if they continue or are bothersome): ?diarrhea ?hair loss ?  nausea, vomiting ?tiredness ?This list may not describe all possible side effects. Call your doctor for medical advice about side effects. You may report side effects to FDA at 1-800-FDA-1088. ?Where should I keep my medication? ?This drug is  given in a hospital or clinic and will not be stored at home. ?NOTE: This sheet is a summary. It may not cover all possible information. If you have questions about this medicine, talk to your doctor, pharmacist, or health care provider. ?? 2022 [employer] Standard (2015-07-27 00:00:00) ?Trastuzumab injection for infusion ?What is this medication? ?TRASTUZUMAB (tras TOO zoo mab) is a monoclonal antibody. It is used to treat breast cancer and stomach cancer. ?This medicine may be used for other purposes; ask your health care provider or pharmacist if you have questions. ?COMMON BRAND NAME(S): Herceptin, Belenda Cruise, Ogivri, Ontruzant, Trazimera ?What should I tell my care team before I take this medication? ?They need to know if you have any of these conditions: ?heart disease ?heart failure ?lung or breathing disease, like asthma ?an unusual or allergic reaction to trastuzumab, benzyl alcohol, or other medications, foods, dyes, or preservatives ?pregnant or trying to get pregnant ?breast-feeding ?How should I use this medication? ?This drug is given as an infusion into a vein. It is administered in a hospital or clinic by a specially trained health care professional. ?Talk to your pediatrician regarding the use of this medicine in children. This medicine is not approved for use in children. ?Overdosage: If you think you have taken too much of this medicine contact a poison control center or emergency room at once. ?NOTE: This medicine is only for you. Do not share this medicine with others. ?What if I miss a dose? ?It is important not to miss a dose. Call your doctor or health care professional if you are unable to keep an appointment. ?What may interact with this medication? ?This medicine may interact with the following medications: ?certain types of chemotherapy, such as daunorubicin, doxorubicin, epirubicin, and idarubicin ?This list may not describe all possible interactions. Give your health care  provider a list of all the medicines, herbs, non-prescription drugs, or dietary supplements you use. Also tell them if you smoke, drink alcohol, or use illegal drugs. Some items may interact with your medicine. ?What should I watch for while using this medication? ?Visit your doctor for checks on your progress. Report any side effects. Continue your course of treatment even though you feel ill unless your doctor tells you to stop. ?Call your doctor or health care professional for advice if you get a fever, chills or sore throat, or other symptoms of a cold or flu. Do not treat yourself. Try to avoid being around people who are sick. ?You may experience fever, chills and shaking during your first infusion. These effects are usually mild and can be treated with other medicines. Report any side effects during the infusion to your health care professional. Fever and chills usually do not happen with later infusions. ?Do not become pregnant while taking this medicine or for 7 months after stopping it. Women should inform their doctor if they wish to become pregnant or think they might be pregnant. Women of child-bearing potential will need to have a negative pregnancy test before starting this medicine. There is a potential for serious side effects to an unborn child. Talk to your health care professional or pharmacist for more information. Do not breast-feed an infant while taking this medicine or for 7 months after stopping it. ?Women must use  effective birth control with this medicine. ?What side effects may I notice from receiving this medication? ?Side effects that you should report to your doctor or health care professional as soon as possible: ?allergic reactions like skin rash, itching or hives, swelling of the face, lips, or tongue ?chest pain or palpitations ?cough ?dizziness ?feeling faint or lightheaded, falls ?fever ?general ill feeling or flu-like symptoms ?signs of worsening heart failure like breathing  problems; swelling in your legs and feet ?unusually weak or tired ?Side effects that usually do not require medical attention (report to your doctor or health care professional if they continue or are botherso

## 2021-10-16 NOTE — Progress Notes (Unsigned)
? ?  93306 - ECHOCARDIOGRAPHY, TRANSTHORACIC, REAL-TIME WITH IMAGE DOCUMENTATION (2D), INCLUDES M-MODE RECORDING, WHEN PERFORMED, COMPLETE, WITH SPECTRAL DOPPLER ECHOCARDIOGRAPHY, AND WITH COLOR FLOW DOPPLER ECHOCARDIOGRAPHY ? ?No Precertification Required ?

## 2021-10-17 ENCOUNTER — Other Ambulatory Visit: Payer: Self-pay | Admitting: Surgery

## 2021-10-17 ENCOUNTER — Encounter: Payer: Self-pay | Admitting: Oncology

## 2021-10-17 DIAGNOSIS — D0510 Intraductal carcinoma in situ of unspecified breast: Secondary | ICD-10-CM

## 2021-10-23 ENCOUNTER — Ambulatory Visit (INDEPENDENT_AMBULATORY_CARE_PROVIDER_SITE_OTHER): Payer: Managed Care, Other (non HMO)

## 2021-10-23 DIAGNOSIS — C50411 Malignant neoplasm of upper-outer quadrant of right female breast: Secondary | ICD-10-CM | POA: Diagnosis not present

## 2021-10-23 DIAGNOSIS — I5030 Unspecified diastolic (congestive) heart failure: Secondary | ICD-10-CM | POA: Diagnosis not present

## 2021-10-23 DIAGNOSIS — Z0189 Encounter for other specified special examinations: Secondary | ICD-10-CM

## 2021-10-23 DIAGNOSIS — Z171 Estrogen receptor negative status [ER-]: Secondary | ICD-10-CM | POA: Diagnosis not present

## 2021-10-23 LAB — ECHOCARDIOGRAM COMPLETE
Area-P 1/2: 2.37 cm2
S' Lateral: 3.1 cm

## 2021-10-25 ENCOUNTER — Ambulatory Visit
Admission: RE | Admit: 2021-10-25 | Discharge: 2021-10-25 | Disposition: A | Discharge status: Home / Self Care | Payer: Managed Care, Other (non HMO) | Source: Ambulatory Visit | Attending: Surgery | Admitting: Surgery

## 2021-10-25 DIAGNOSIS — D0510 Intraductal carcinoma in situ of unspecified breast: Secondary | ICD-10-CM

## 2021-10-25 IMAGING — MR MR BREAST BILAT WO/W CM
7 of 10 series · 32 of 48 positions shown · IV contrast (8ml gadavist)
Comparison: Previous exam(s).

CLINICAL DATA: Patient with history of right breast cancer status
post lumpectomy [VY] with recent biopsies of the right breast
demonstrating high grade DCIS.

EXAM:
BILATERAL BREAST MRI WITH AND WITHOUT CONTRAST
TECHNIQUE: Multiplanar, multisequence MR images of both breasts were obtained
prior to and following the intravenous administration of 8 ml of
Gadavist

[Series 2: t2_tirm_tra ipat (a-p) · axial · 3.0mm · 0.78mm/px · 1 of 55 slices shown]
[im 1/55]
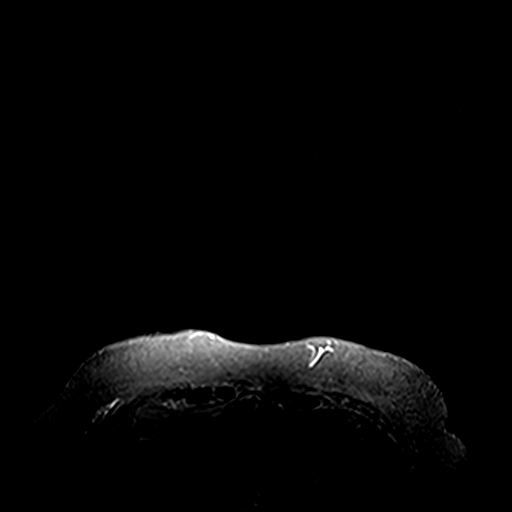

[Series 3: fl3d pre-cm no · axial · non-contrast · 1.2mm · 1.04mm/px · z∈[-116,+55]mm · 5 of 144 slices shown]
[im 1/144]
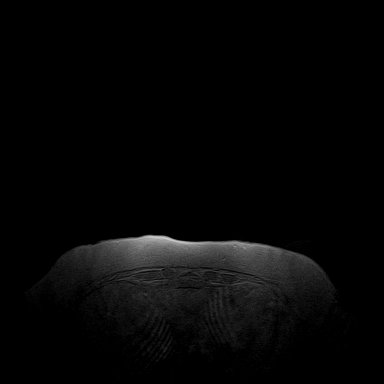
[im 36/144]
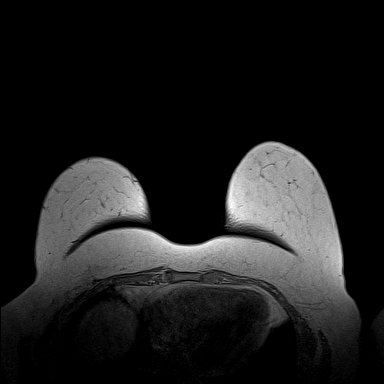
[im 72/144]
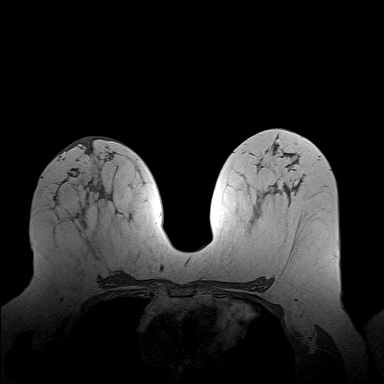
[im 108/144]
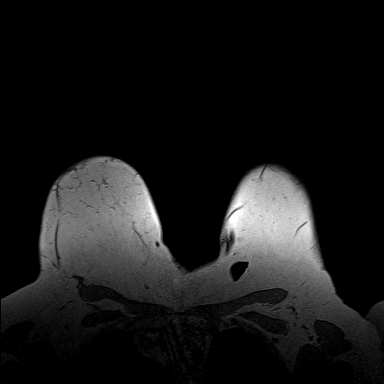
[im 144/144]
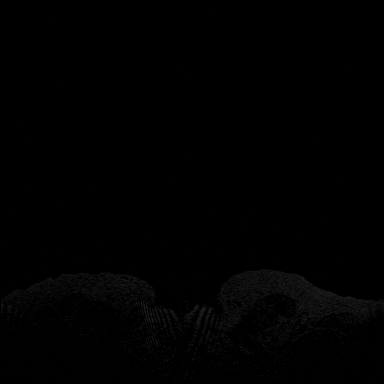

[Series 4: fl3d pre-cm · axial · non-contrast · 1.2mm · 1.04mm/px · z∈[-116,+55]mm · 5 of 144 slices shown]
[im 1/144]
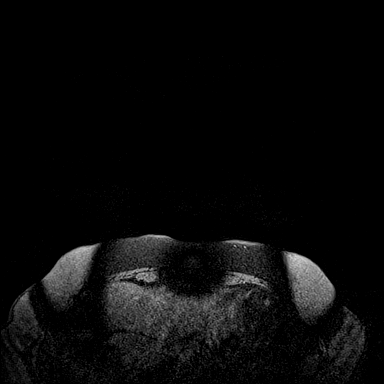
[im 36/144]
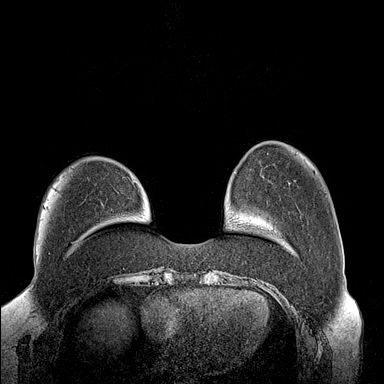
[im 72/144]
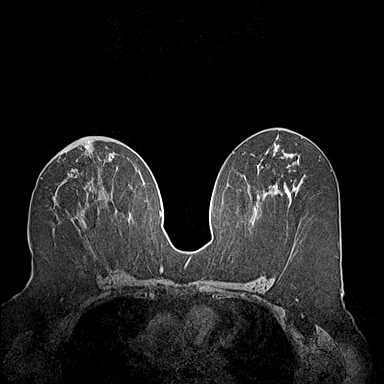
[im 108/144]
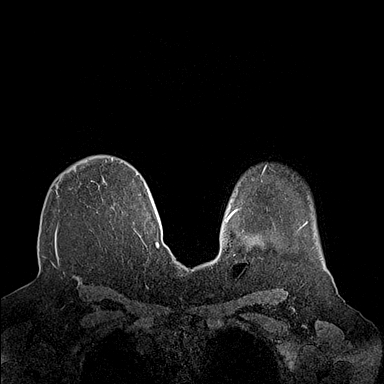
[im 144/144]
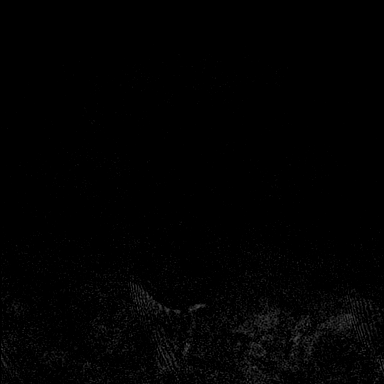

[Series 5: fl3d post-cm 20 · axial · 1.2mm · 1.04mm/px · z∈[-116,+55]mm · 6 of 144 slices shown (1 of 2)]
[im 1/144]
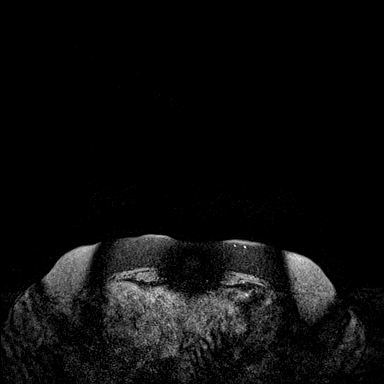
[im 29/144]
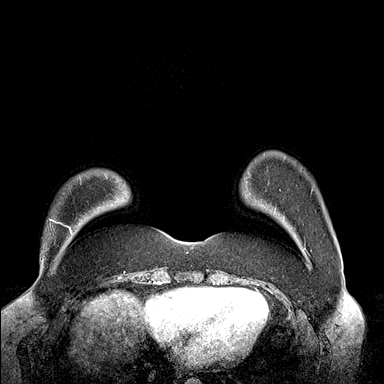
[im 58/144]
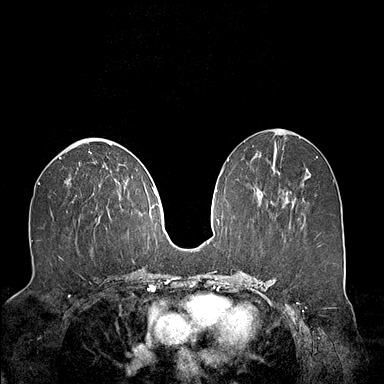
[im 86/144]
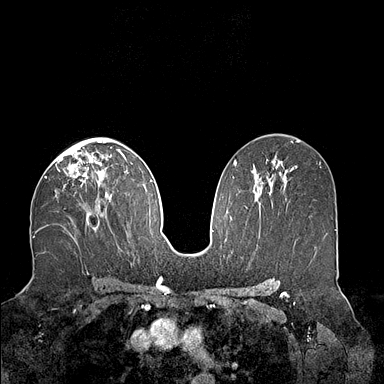
[im 115/144]
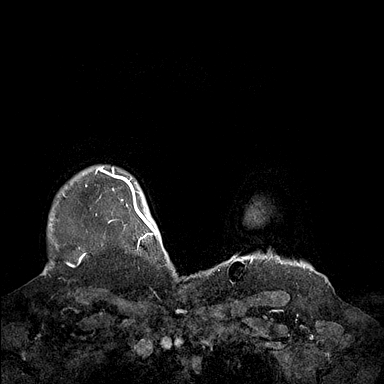
[im 144/144]
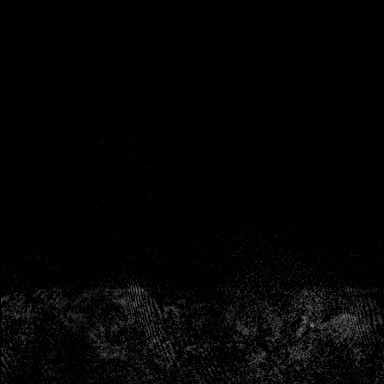

[Series 6: fl3d post-cm 20 · axial · 1.2mm · 1.04mm/px · z∈[-116,+55]mm · 6 of 144 slices shown (2 of 2)]
[im 1/144]
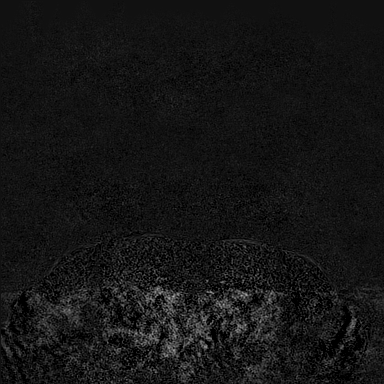
[im 29/144]
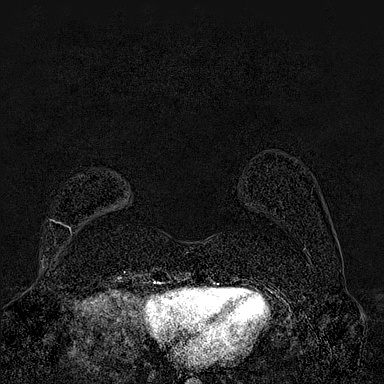
[im 58/144]
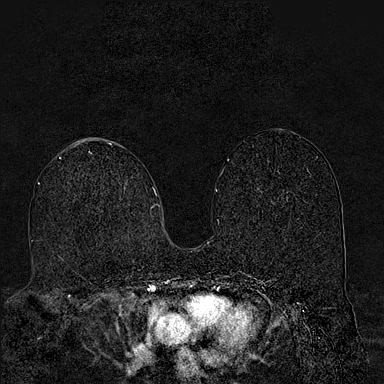
[im 86/144]
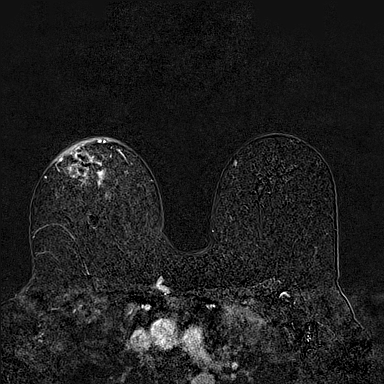
[im 115/144]
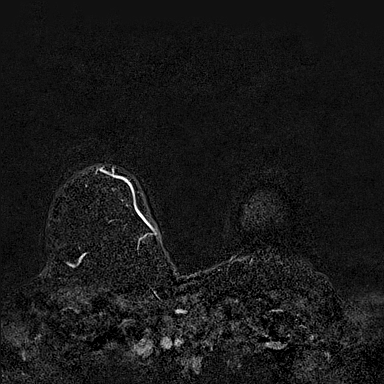
[im 144/144]
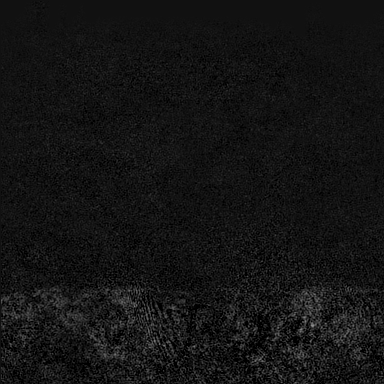

[Series 8: fl3d post-cm 3 · axial · 1.2mm · 1.04mm/px · z∈[-116,+55]mm · 6 of 144 slices shown (1 of 2)]
[im 1/144]
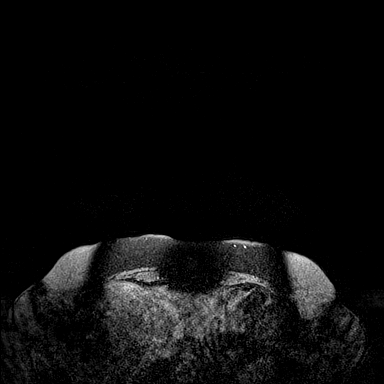
[im 29/144]
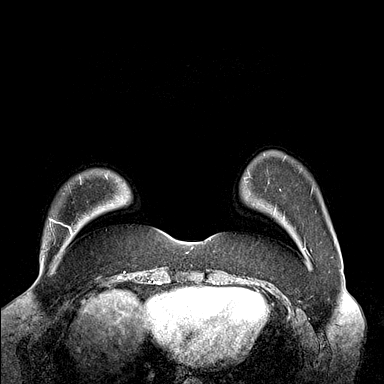
[im 58/144]
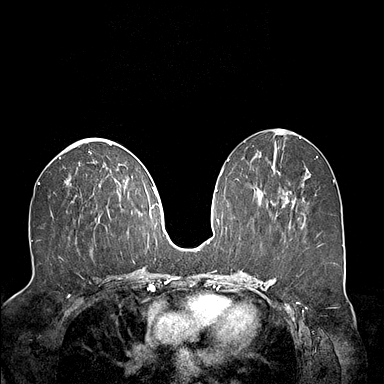
[im 86/144]
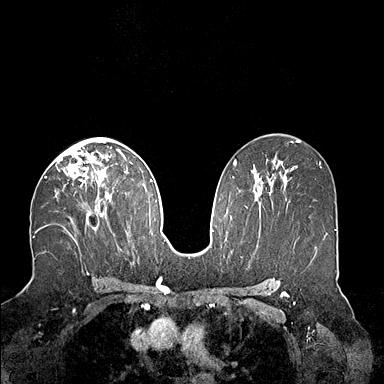
[im 115/144]
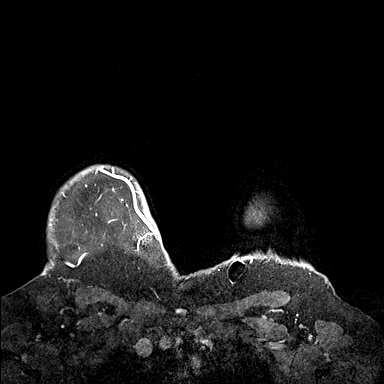
[im 144/144]
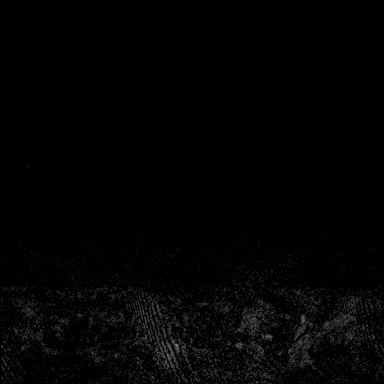

[Series 9: fl3d post-cm 3 · axial · 1.2mm · 1.04mm/px · z∈[-116,-48]mm · 3 of 144 slices shown (2 of 2)]
[im 1/144]
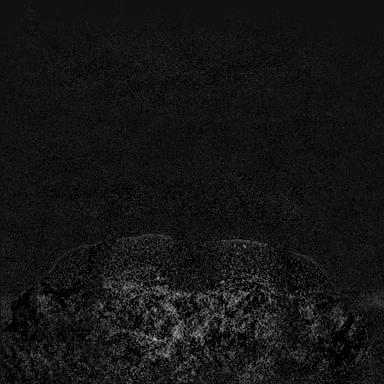
[im 29/144]
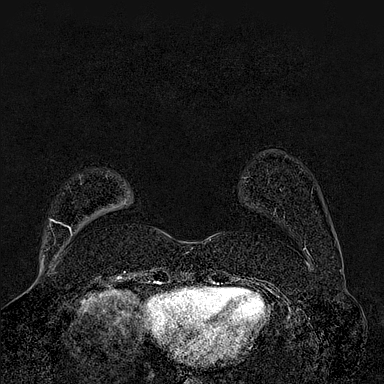
[im 58/144]
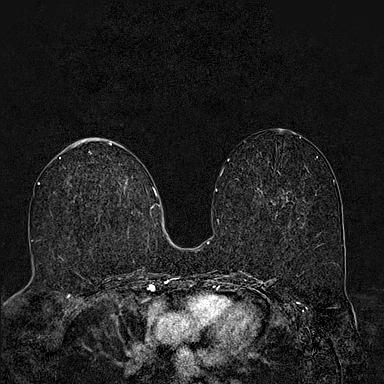

[32 of 48 positions shown; findings below may reference images not displayed]

Three-dimensional MR images were rendered by post-processing of the
original MR data on an independent workstation. The
three-dimensional MR images were interpreted, and findings are
reported in the following complete MRI report for this study. Three
dimensional images were evaluated at the independent interpreting
workstation using the DynaCAD thin client.
FINDINGS: Breast composition: b. Scattered fibroglandular tissue.

Background parenchymal enhancement: Minimal

Right breast: Susceptibility artifact is demonstrated within the
retroareolar slightly lateral right breast, two sites, representing
the sites of recent right breast biopsy. There is extensive patchy
non mass enhancement extending from the right nipple areolar complex
into the right breast superior and medial measuring up to
approximately 6.4 x 6.1 cm (image 56; series 6).

Left breast: No mass or abnormal enhancement.

Lymph nodes: No abnormal appearing lymph nodes.

Ancillary findings: Left anterior chest wall Port-A-Cath is present.
IMPRESSION: Extensive non mass enhancement extending from the right nipple
posterior and superior measuring up to approximately 6.4 cm
concerning for extensive associated DCIS/IDC.

RECOMMENDATION:
Given the size and location of the findings (abutting the
nipple-areolar complex) as well as the patient recently being
treated for right breast cancer, mastectomy of the right breast is
likely considered. If the treatment plan involves the discussion of
breast conservation, MRI biopsy of the furthest extent would be
recommended as it extends posterior to the current biopsy marking
clips.

BI-RADS CATEGORY  6: Known biopsy-proven malignancy.

## 2021-10-25 MED ORDER — GADOBUTROL 1 MMOL/ML IV SOLN
8.0000 mL | Freq: Once | INTRAVENOUS | Status: AC | PRN
  Administered 2021-10-25: 8 mL via INTRAVENOUS

## 2021-10-26 ENCOUNTER — Other Ambulatory Visit: Payer: Self-pay | Admitting: Surgery

## 2021-10-26 DIAGNOSIS — R9389 Abnormal findings on diagnostic imaging of other specified body structures: Secondary | ICD-10-CM

## 2021-11-05 ENCOUNTER — Inpatient Hospital Stay: Payer: Managed Care, Other (non HMO) | Attending: Oncology

## 2021-11-05 DIAGNOSIS — Z5112 Encounter for antineoplastic immunotherapy: Secondary | ICD-10-CM | POA: Insufficient documentation

## 2021-11-05 DIAGNOSIS — C50419 Malignant neoplasm of upper-outer quadrant of unspecified female breast: Secondary | ICD-10-CM

## 2021-11-05 DIAGNOSIS — Z171 Estrogen receptor negative status [ER-]: Secondary | ICD-10-CM

## 2021-11-05 DIAGNOSIS — C50911 Malignant neoplasm of unspecified site of right female breast: Secondary | ICD-10-CM | POA: Insufficient documentation

## 2021-11-05 LAB — COMPREHENSIVE METABOLIC PANEL
Albumin: 4.2 (ref 3.5–5.0)
Calcium: 9 (ref 8.7–10.7)

## 2021-11-05 LAB — CBC AND DIFFERENTIAL
HCT: 40 (ref 36–46)
Hemoglobin: 13 (ref 12.0–16.0)
Neutrophils Absolute: 2.37
Platelets: 192 10*3/uL (ref 150–400)
WBC: 4.3

## 2021-11-05 LAB — BASIC METABOLIC PANEL
BUN: 16 (ref 4–21)
CO2: 26 — AB (ref 13–22)
Chloride: 108 (ref 99–108)
Creatinine: 0.7 (ref 0.5–1.1)
Glucose: 102
Potassium: 4.2 mEq/L (ref 3.5–5.1)
Sodium: 143 (ref 137–147)

## 2021-11-05 LAB — MAGNESIUM: Magnesium: 2.1

## 2021-11-05 LAB — HEPATIC FUNCTION PANEL
ALT: 21 U/L (ref 7–35)
AST: 22 (ref 13–35)
Alkaline Phosphatase: 68 (ref 25–125)
Bilirubin, Total: 0.5

## 2021-11-05 LAB — CBC: RBC: 4.58 (ref 3.87–5.11)

## 2021-11-06 ENCOUNTER — Inpatient Hospital Stay: Payer: Managed Care, Other (non HMO)

## 2021-11-06 VITALS — BP 127/64 | HR 74 | Temp 98.3°F | Resp 18 | Ht 64.0 in | Wt 188.0 lb

## 2021-11-06 DIAGNOSIS — Z5112 Encounter for antineoplastic immunotherapy: Secondary | ICD-10-CM | POA: Diagnosis present

## 2021-11-06 DIAGNOSIS — C50911 Malignant neoplasm of unspecified site of right female breast: Secondary | ICD-10-CM | POA: Diagnosis present

## 2021-11-06 DIAGNOSIS — Z171 Estrogen receptor negative status [ER-]: Secondary | ICD-10-CM

## 2021-11-06 MED ORDER — SODIUM CHLORIDE 0.9 % IV SOLN
420.0000 mg | Freq: Once | INTRAVENOUS | Status: AC
  Administered 2021-11-06: 420 mg via INTRAVENOUS
  Filled 2021-11-06: qty 14

## 2021-11-06 MED ORDER — TRASTUZUMAB-ANNS CHEMO 150 MG IV SOLR
6.0000 mg/kg | Freq: Once | INTRAVENOUS | Status: AC
  Administered 2021-11-06: 525 mg via INTRAVENOUS
  Filled 2021-11-06: qty 25

## 2021-11-06 MED ORDER — HEPARIN SOD (PORK) LOCK FLUSH 100 UNIT/ML IV SOLN
500.0000 [IU] | Freq: Once | INTRAVENOUS | Status: AC | PRN
  Administered 2021-11-06: 500 [IU]

## 2021-11-06 MED ORDER — SODIUM CHLORIDE 0.9% FLUSH
10.0000 mL | INTRAVENOUS | Status: DC | PRN
  Administered 2021-11-06: 10 mL

## 2021-11-06 MED ORDER — SODIUM CHLORIDE 0.9 % IV SOLN: Freq: Once | INTRAVENOUS | Status: AC

## 2021-11-06 MED ORDER — DIPHENHYDRAMINE HCL 25 MG PO CAPS: 50.0000 mg | ORAL_CAPSULE | Freq: Once | ORAL | Status: DC

## 2021-11-06 MED ORDER — ACETAMINOPHEN 325 MG PO TABS: 650.0000 mg | ORAL_TABLET | Freq: Once | ORAL | Status: DC

## 2021-11-06 NOTE — Patient Instructions (Signed)
Pertuzumab injection ?What is this medication? ?PERTUZUMAB (per TOOZ ue mab) is a monoclonal antibody. It is used to treat breast cancer. ?This medicine may be used for other purposes; ask your health care provider or pharmacist if you have questions. ?COMMON BRAND NAME(S): PERJETA ?What should I tell my care team before I take this medication? ?They need to know if you have any of these conditions: ?heart disease ?heart failure ?high blood pressure ?history of irregular heart beat ?recent or ongoing radiation therapy ?an unusual or allergic reaction to pertuzumab, other medicines, foods, dyes, or preservatives ?pregnant or trying to get pregnant ?breast-feeding ?How should I use this medication? ?This medicine is for infusion into a vein. It is given by a health care professional in a hospital or clinic setting. ?Talk to your pediatrician regarding the use of this medicine in children. Special care may be needed. ?Overdosage: If you think you have taken too much of this medicine contact a poison control center or emergency room at once. ?NOTE: This medicine is only for you. Do not share this medicine with others. ?What if I miss a dose? ?It is important not to miss your dose. Call your doctor or health care professional if you are unable to keep an appointment. ?What may interact with this medication? ?Interactions are not expected. ?Give your health care provider a list of all the medicines, herbs, non-prescription drugs, or dietary supplements you use. Also tell them if you smoke, drink alcohol, or use illegal drugs. Some items may interact with your medicine. ?This list may not describe all possible interactions. Give your health care provider a list of all the medicines, herbs, non-prescription drugs, or dietary supplements you use. Also tell them if you smoke, drink alcohol, or use illegal drugs. Some items may interact with your medicine. ?What should I watch for while using this medication? ?Your condition  will be monitored carefully while you are receiving this medicine. Report any side effects. Continue your course of treatment even though you feel ill unless your doctor tells you to stop. ?Do not become pregnant while taking this medicine or for 7 months after stopping it. Women should inform their doctor if they wish to become pregnant or think they might be pregnant. Women of child-bearing potential will need to have a negative pregnancy test before starting this medicine. There is a potential for serious side effects to an unborn child. Talk to your health care professional or pharmacist for more information. Do not breast-feed an infant while taking this medicine or for 7 months after stopping it. ?Women must use effective birth control with this medicine. ?Call your doctor or health care professional for advice if you get a fever, chills or sore throat, or other symptoms of a cold or flu. Do not treat yourself. Try to avoid being around people who are sick. ?You may experience fever, chills, and headache during the infusion. Report any side effects during the infusion to your health care professional. ?What side effects may I notice from receiving this medication? ?Side effects that you should report to your doctor or health care professional as soon as possible: ?breathing problems ?chest pain or palpitations ?dizziness ?feeling faint or lightheaded ?fever or chills ?skin rash, itching or hives ?sore throat ?swelling of the face, lips, or tongue ?swelling of the legs or ankles ?unusually weak or tired ?Side effects that usually do not require medical attention (report to your doctor or health care professional if they continue or are bothersome): ?diarrhea ?hair loss ?  nausea, vomiting ?tiredness ?This list may not describe all possible side effects. Call your doctor for medical advice about side effects. You may report side effects to FDA at 1-800-FDA-1088. ?Where should I keep my medication? ?This drug is  given in a hospital or clinic and will not be stored at home. ?NOTE: This sheet is a summary. It may not cover all possible information. If you have questions about this medicine, talk to your doctor, pharmacist, or health care provider. ?? 2023 [employer] Standard (2015-07-27 00:00:00) ?Trastuzumab injection for infusion ?What is this medication? ?TRASTUZUMAB (tras TOO zoo mab) is a monoclonal antibody. It is used to treat breast cancer and stomach cancer. ?This medicine may be used for other purposes; ask your health care provider or pharmacist if you have questions. ?COMMON BRAND NAME(S): Herceptin, Belenda Cruise, Ogivri, Ontruzant, Trazimera ?What should I tell my care team before I take this medication? ?They need to know if you have any of these conditions: ?heart disease ?heart failure ?lung or breathing disease, like asthma ?an unusual or allergic reaction to trastuzumab, benzyl alcohol, or other medications, foods, dyes, or preservatives ?pregnant or trying to get pregnant ?breast-feeding ?How should I use this medication? ?This drug is given as an infusion into a vein. It is administered in a hospital or clinic by a specially trained health care professional. ?Talk to your pediatrician regarding the use of this medicine in children. This medicine is not approved for use in children. ?Overdosage: If you think you have taken too much of this medicine contact a poison control center or emergency room at once. ?NOTE: This medicine is only for you. Do not share this medicine with others. ?What if I miss a dose? ?It is important not to miss a dose. Call your doctor or health care professional if you are unable to keep an appointment. ?What may interact with this medication? ?This medicine may interact with the following medications: ?certain types of chemotherapy, such as daunorubicin, doxorubicin, epirubicin, and idarubicin ?This list may not describe all possible interactions. Give your health care  provider a list of all the medicines, herbs, non-prescription drugs, or dietary supplements you use. Also tell them if you smoke, drink alcohol, or use illegal drugs. Some items may interact with your medicine. ?What should I watch for while using this medication? ?Visit your doctor for checks on your progress. Report any side effects. Continue your course of treatment even though you feel ill unless your doctor tells you to stop. ?Call your doctor or health care professional for advice if you get a fever, chills or sore throat, or other symptoms of a cold or flu. Do not treat yourself. Try to avoid being around people who are sick. ?You may experience fever, chills and shaking during your first infusion. These effects are usually mild and can be treated with other medicines. Report any side effects during the infusion to your health care professional. Fever and chills usually do not happen with later infusions. ?Do not become pregnant while taking this medicine or for 7 months after stopping it. Women should inform their doctor if they wish to become pregnant or think they might be pregnant. Women of child-bearing potential will need to have a negative pregnancy test before starting this medicine. There is a potential for serious side effects to an unborn child. Talk to your health care professional or pharmacist for more information. Do not breast-feed an infant while taking this medicine or for 7 months after stopping it. ?Women must use  effective birth control with this medicine. ?What side effects may I notice from receiving this medication? ?Side effects that you should report to your doctor or health care professional as soon as possible: ?allergic reactions like skin rash, itching or hives, swelling of the face, lips, or tongue ?chest pain or palpitations ?cough ?dizziness ?feeling faint or lightheaded, falls ?fever ?general ill feeling or flu-like symptoms ?signs of worsening heart failure like breathing  problems; swelling in your legs and feet ?unusually weak or tired ?Side effects that usually do not require medical attention (report to your doctor or health care professional if they continue or are botherso

## 2021-11-07 ENCOUNTER — Encounter: Payer: Self-pay | Admitting: Oncology

## 2021-11-08 ENCOUNTER — Encounter: Payer: Self-pay | Admitting: Diagnostic Radiology

## 2021-11-12 ENCOUNTER — Ambulatory Visit
Admission: RE | Admit: 2021-11-12 | Discharge: 2021-11-12 | Disposition: A | Discharge status: Home / Self Care | Payer: Managed Care, Other (non HMO) | Source: Ambulatory Visit | Attending: Surgery | Admitting: Surgery

## 2021-11-12 ENCOUNTER — Other Ambulatory Visit (HOSPITAL_COMMUNITY): Payer: Self-pay | Admitting: Diagnostic Radiology

## 2021-11-12 DIAGNOSIS — R9389 Abnormal findings on diagnostic imaging of other specified body structures: Secondary | ICD-10-CM

## 2021-11-12 IMAGING — MR MR BREAST BX W/ LOC DEV 1ST LEASION IMAGE BX SPEC MR GUIDE*R*
6 of 8 series · 32 of 48 positions shown · IV contrast (8 ml gadavist)
Comparison: Prior exams
COMPARISON: Prior exams

Addendum:
CLINICAL DATA: Patient presents for stereotactic core needle biopsy
of the posterior extent of non mass enhancement in the right breast.
She has a history of a right lumpectomy for breast carcinoma last
year, and underwent 2 stereotactic core needle biopsies for right
breast calcifications on [DATE], both revealing DCIS. Follow-up
breast MRI showed more extensive right breast non mass enhancement.

EXAM:
MRI GUIDED CORE NEEDLE BIOPSY OF THE RIGHT BREAST
TECHNIQUE: Multiplanar, multisequence MR imaging of the right breast was
performed both before and after administration of intravenous
contrast.
CONTRAST:  8 mL of Gadavist.

[Series 2: fiducial unilateral · sagittal · 2.0mm · 1.33mm/px · 1 of 52 slices shown]
[im 1/52]
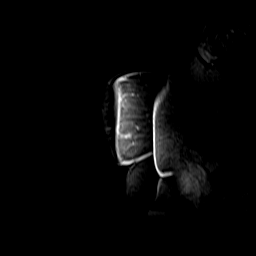

[Series 3: dynamic pre · axial · non-contrast · 1.3mm · 0.73mm/px · z∈[-73,+113]mm · 6 of 144 slices shown]
[im 1/144]
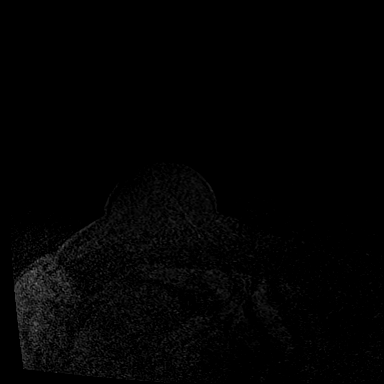
[im 29/144]
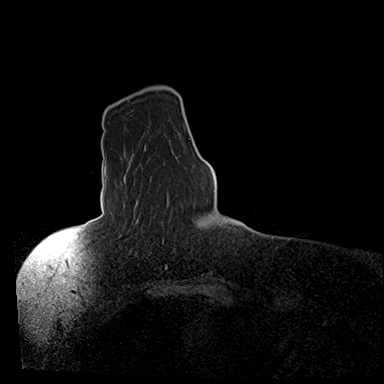
[im 58/144]
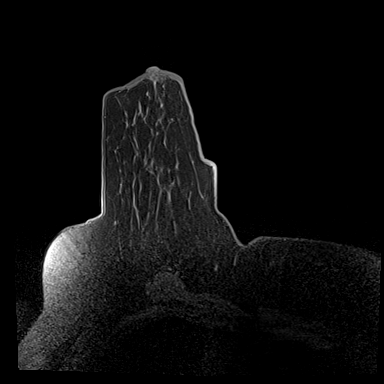
[im 86/144]
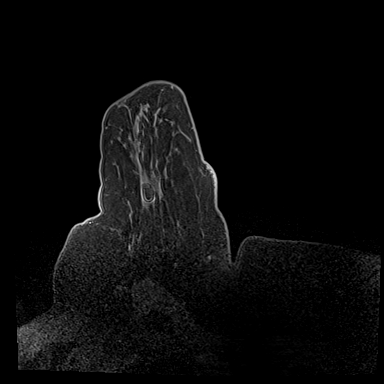
[im 115/144]
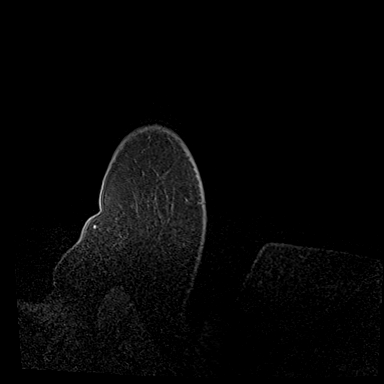
[im 144/144]
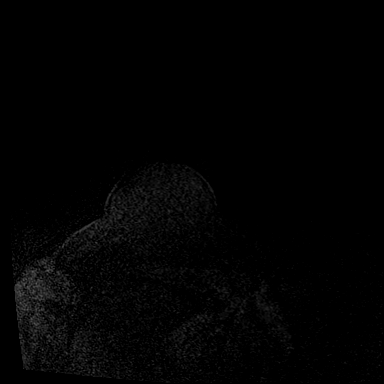

[Series 4: dynamic post 20 · axial · 1.3mm · 0.73mm/px · z∈[-73,+113]mm · 6 of 144 slices shown (1 of 2)]
[im 1/144]
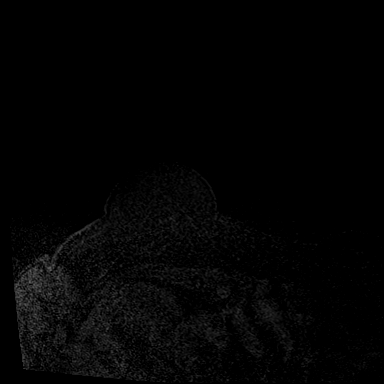
[im 29/144]
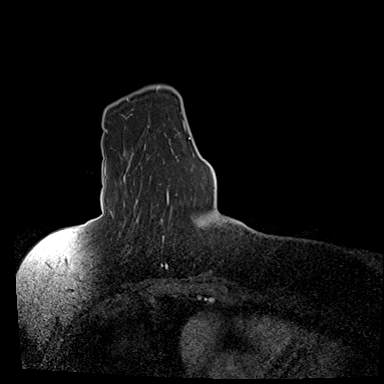
[im 58/144]
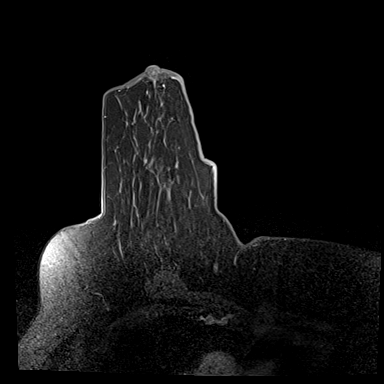
[im 86/144]
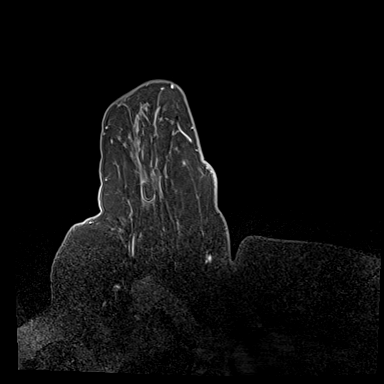
[im 115/144]
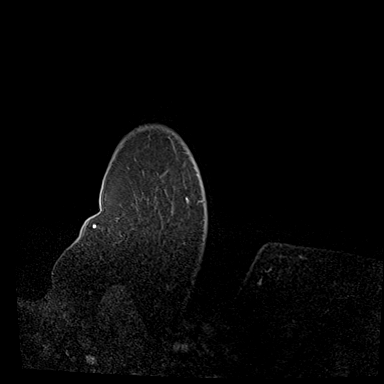
[im 144/144]
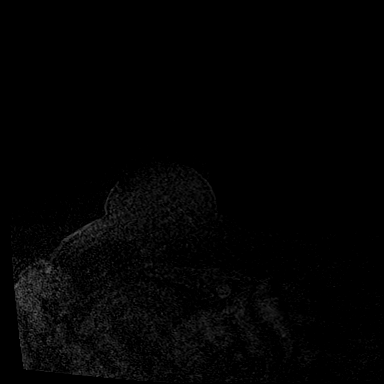

[Series 5: dynamic post 20 · axial · 1.3mm · 0.73mm/px · z∈[-73,+113]mm · 7 of 144 slices shown (2 of 2)]
[im 1/144]
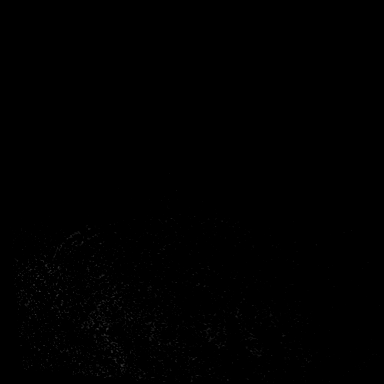
[im 24/144]
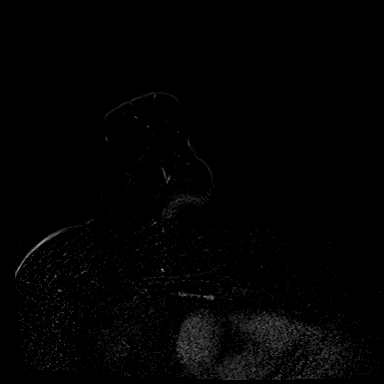
[im 48/144]
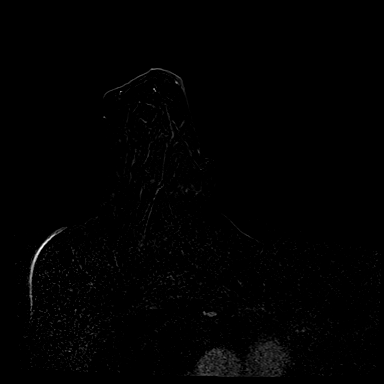
[im 72/144]
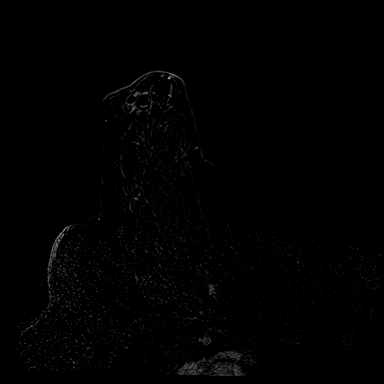
[im 96/144]
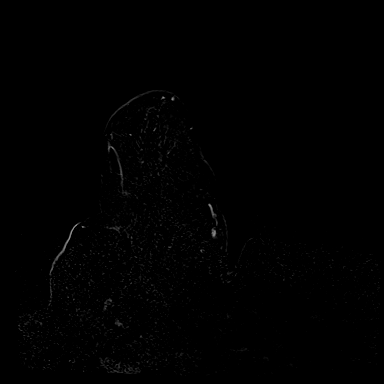
[im 120/144]
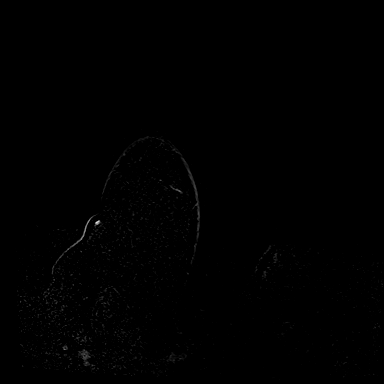
[im 144/144]
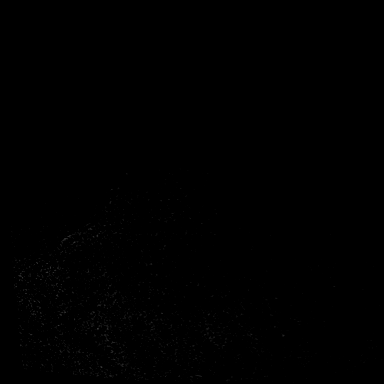

[Series 6: dynamic post 3 · axial · 1.3mm · 0.73mm/px · z∈[-73,+113]mm · 7 of 144 slices shown (1 of 2)]
[im 1/144]
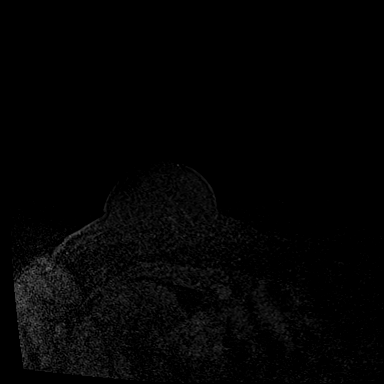
[im 24/144]
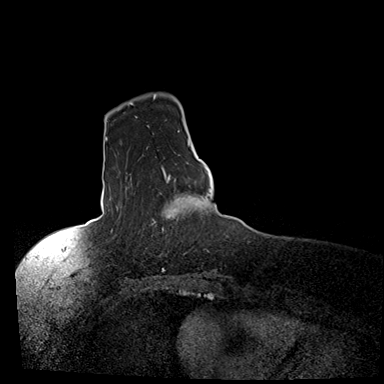
[im 48/144]
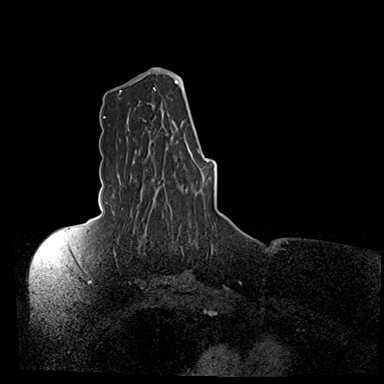
[im 72/144]
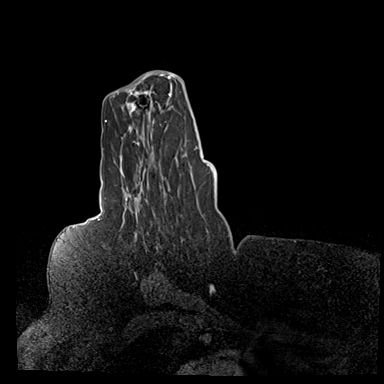
[im 96/144]
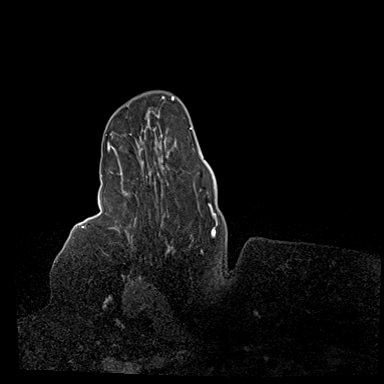
[im 120/144]
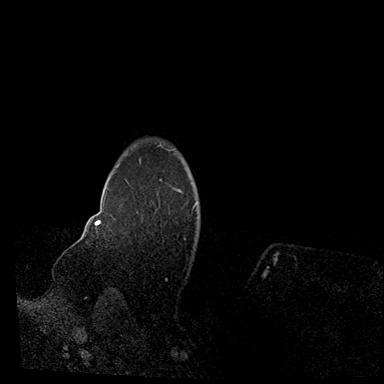
[im 144/144]
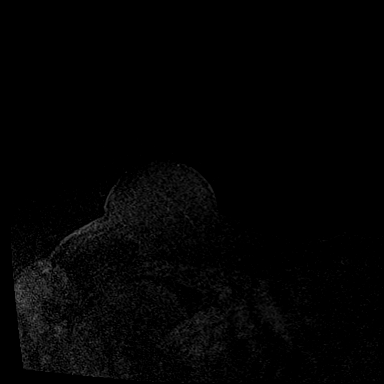

[Series 7: dynamic post 3 · axial · 1.3mm · 0.73mm/px · z∈[-73,+50]mm · 5 of 144 slices shown (2 of 2)]
[im 1/144]
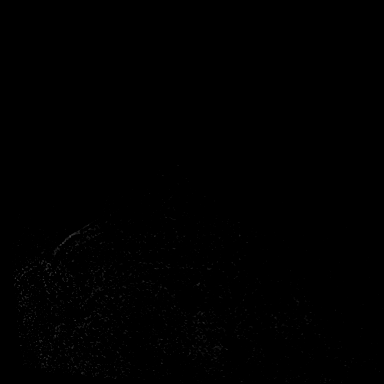
[im 24/144]
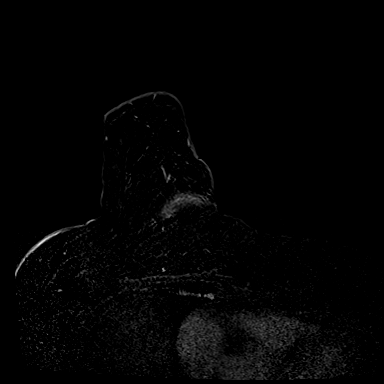
[im 48/144]
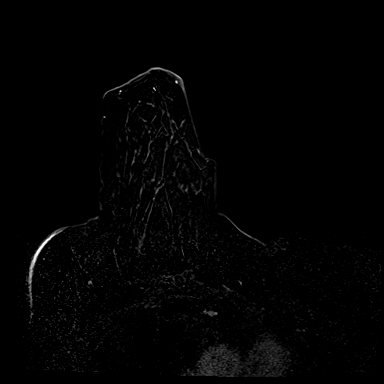
[im 72/144]
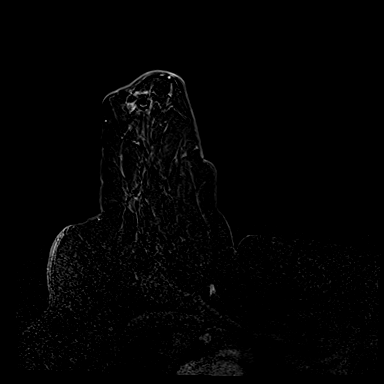
[im 96/144]
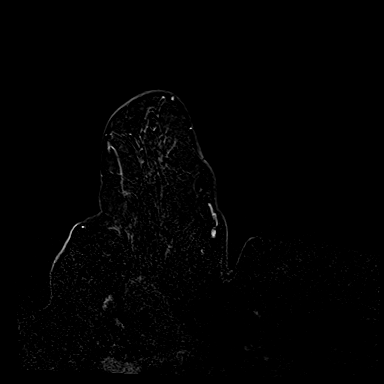

[32 of 48 positions shown; findings below may reference images not displayed]

FINDINGS: I met with the patient, and we discussed the procedure of MRI guided
biopsy, including risks, benefits, and alternatives. Specifically,
we discussed the risks of infection, bleeding, tissue injury, clip
migration, and inadequate sampling. Informed, written consent was
given. The usual time out protocol was performed immediately prior
to the procedure.

Using sterile technique, 1% Lidocaine, MRI guidance, and a 9 gauge
vacuum assisted device, biopsy was performed of the posterior extent
of right breast non mass enhancement using a lateral approach. At
the conclusion of the procedure, a cylinder shaped tissue marker
clip was deployed into the biopsy cavity. Follow-up 2-view mammogram
was performed and dictated separately.
IMPRESSION: MRI guided biopsy of of the right breast. No apparent complications.

ADDENDUM:
Pathology revealed BENIGN FIBROCYSTIC CHANGES- NO MALIGNANCY
IDENTIFIED of the RIGHT breast, central, posterior extent (cylinder
clip). This was found to be concordant by Dr. BARRAGAN.

Pathology results were discussed with the patient by telephone. The
patient reported doing well after the biopsy with tenderness at the
site. Post biopsy instructions and care were reviewed and questions
were answered. The patient was encouraged to call The [REDACTED]

The patient has a recent diagnosis of right breast cancer and should
follow her outlined treatment plan.

Pathology results reported by BARRAGAN RN on [DATE].

*** End of Addendum ***
FINDINGS: I met with the patient, and we discussed the procedure of MRI guided
biopsy, including risks, benefits, and alternatives. Specifically,
we discussed the risks of infection, bleeding, tissue injury, clip
migration, and inadequate sampling. Informed, written consent was
given. The usual time out protocol was performed immediately prior
to the procedure.

Using sterile technique, 1% Lidocaine, MRI guidance, and a 9 gauge
vacuum assisted device, biopsy was performed of the posterior extent
of right breast non mass enhancement using a lateral approach. At
the conclusion of the procedure, a cylinder shaped tissue marker
clip was deployed into the biopsy cavity. Follow-up 2-view mammogram
was performed and dictated separately.
IMPRESSION: MRI guided biopsy of of the right breast. No apparent complications.

## 2021-11-12 IMAGING — MG MM BREAST LOCALIZATION CLIP
4 series · 4 of 12 positions shown · non-contrast
Comparison: Previous exam(s).

CLINICAL DATA: Assess post biopsy marker clip placement following
MRI guided needle biopsy of right breast non mass enhancement.

EXAM:
3D DIAGNOSTIC RIGHT MAMMOGRAM POST MRI BIOPSY

[R ML synth-2D]
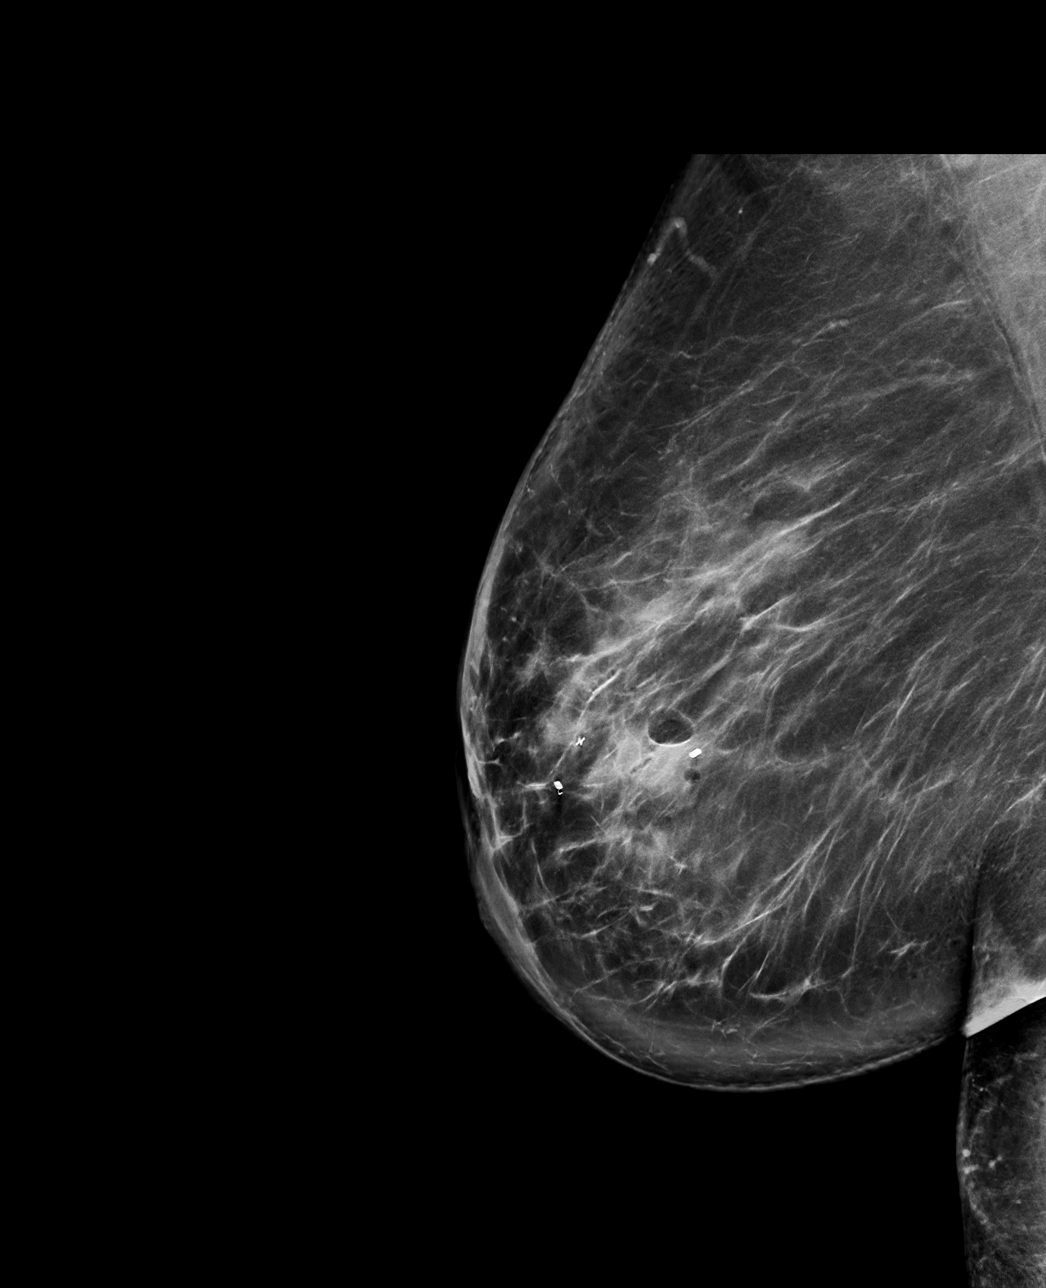

[R CC synth-2D]
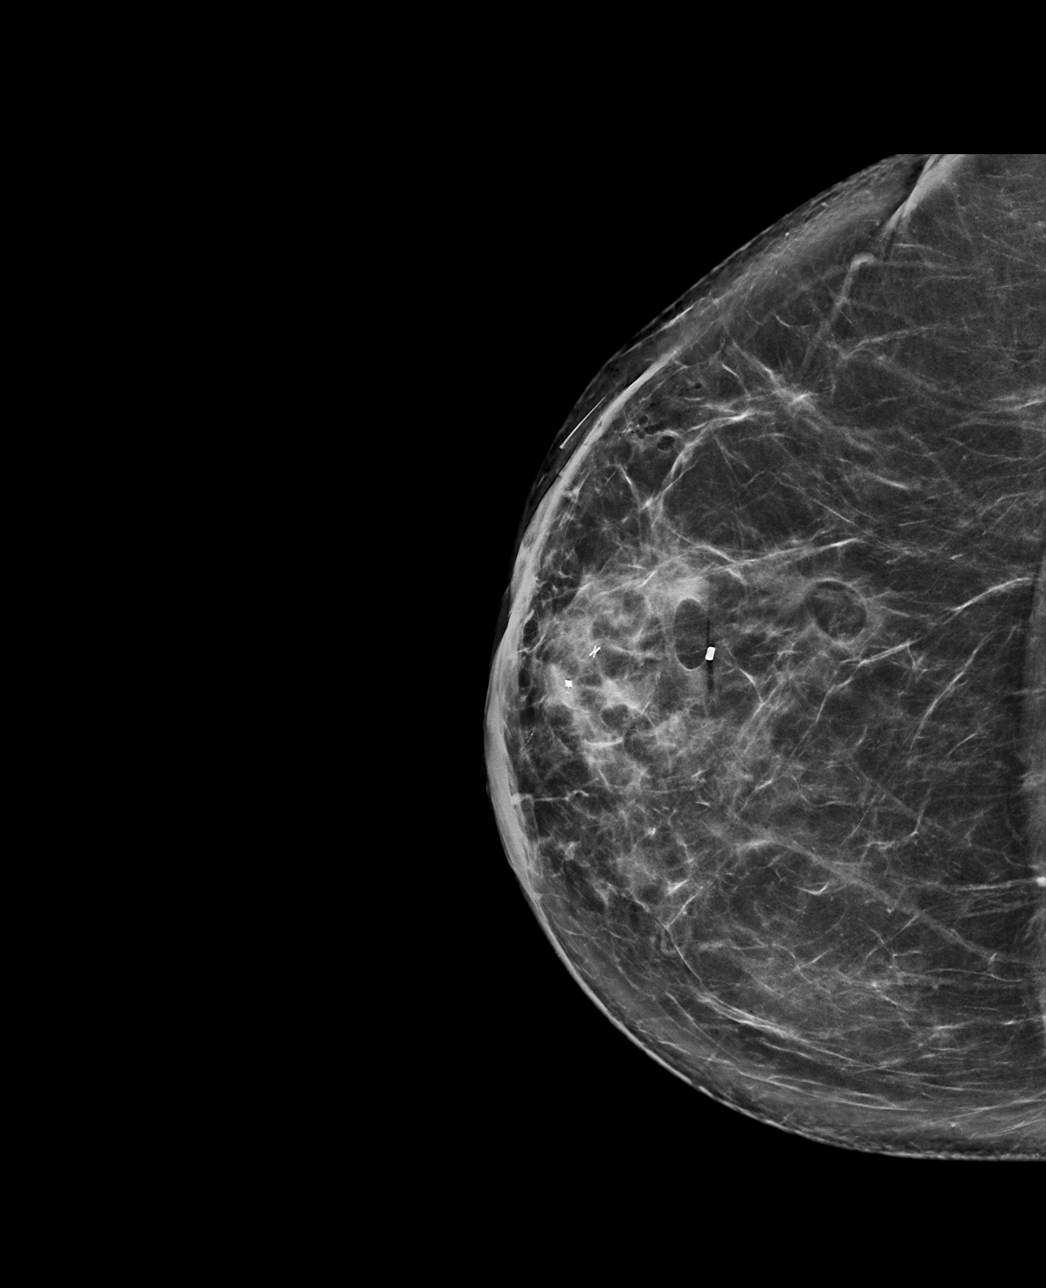

[R ML tomo · tomo slice 51/101.0]
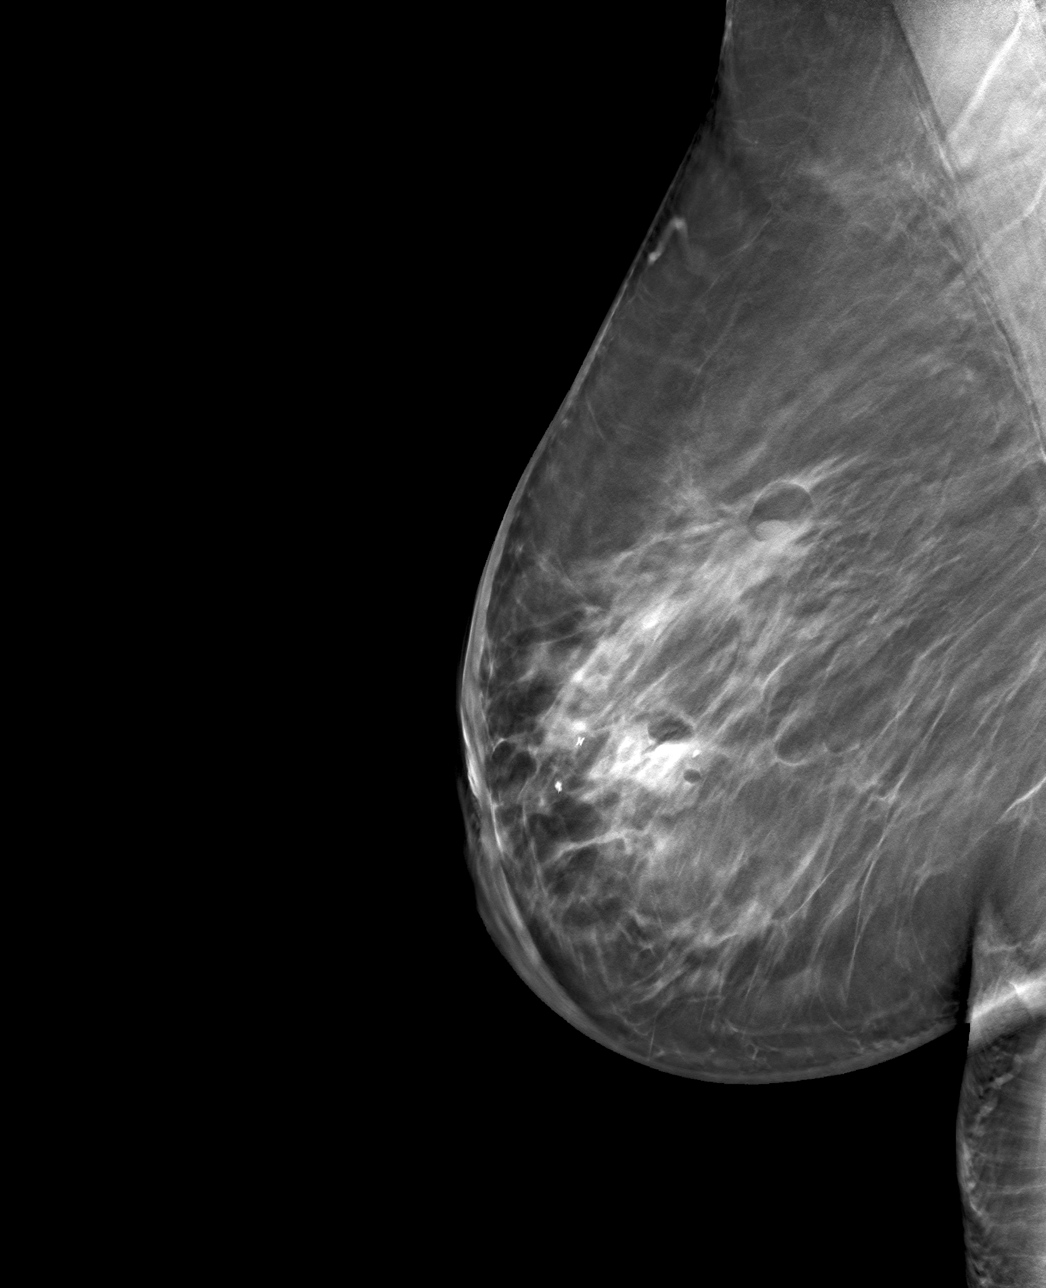

[R CC tomo · tomo slice 45/89.0]
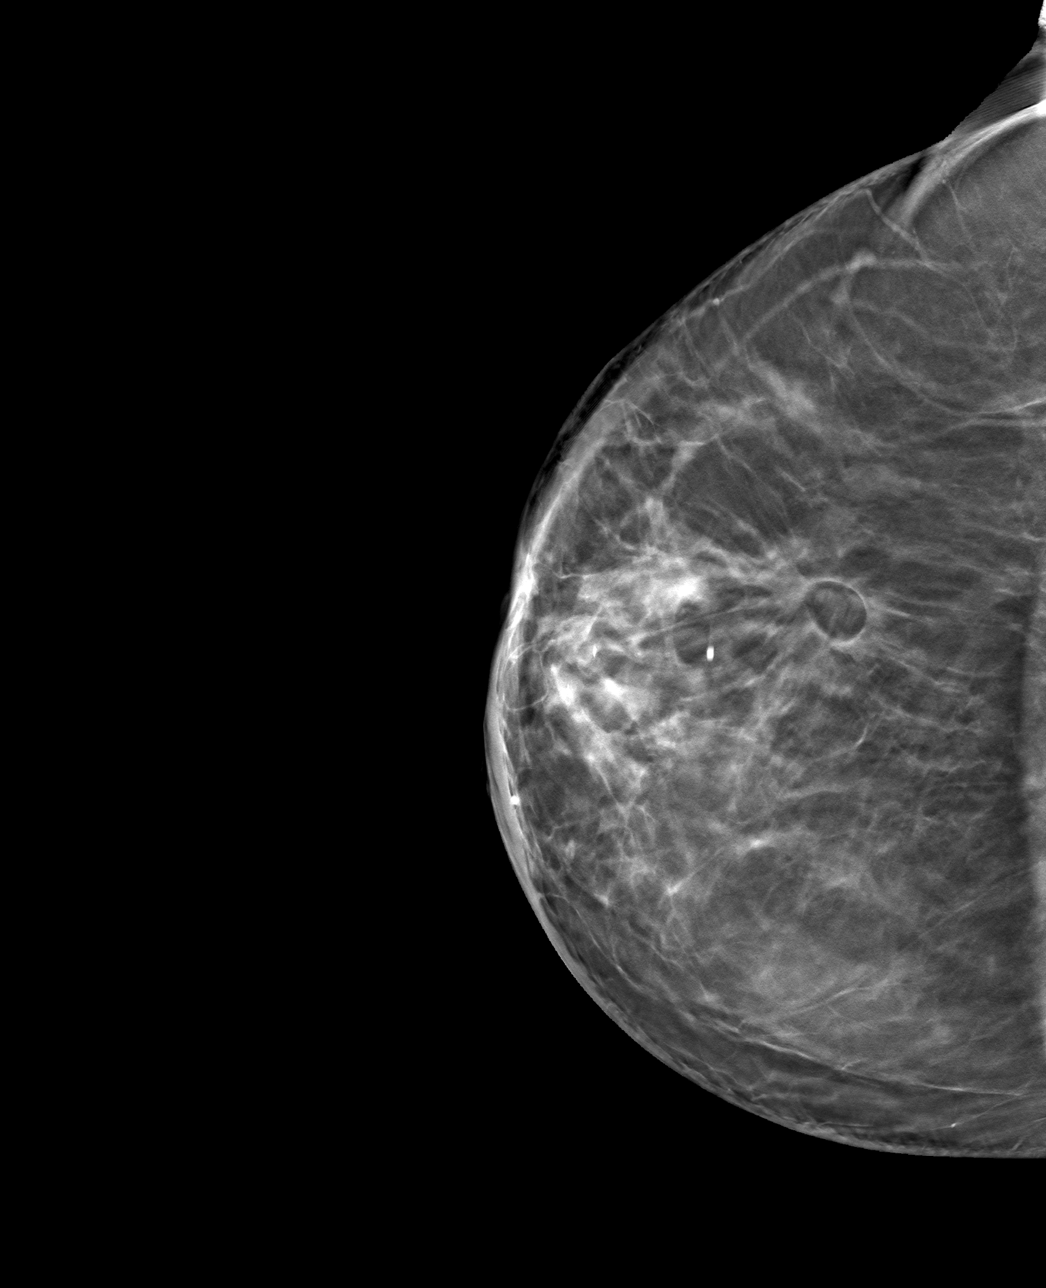

[4 of 12 positions shown; findings below may reference images not displayed]

FINDINGS: 3D Mammographic images were obtained following MRI guided biopsy of
right breast non mass enhancement. The biopsy marking clip is in
expected position at the site of biopsy.
IMPRESSION: Appropriate positioning of the cylinder shaped biopsy marking clip
at the site of biopsy in the central right breast, 2 cm posterior to
the X shaped biopsy clip and 2.7 cm posterior to the coil shaped
biopsy clip from the recent prior stereotactic core needle biopsies.

Final Assessment: Post Procedure Mammograms for Marker Placement

## 2021-11-12 MED ORDER — GADOBUTROL 1 MMOL/ML IV SOLN
8.0000 mL | Freq: Once | INTRAVENOUS | Status: AC | PRN
  Administered 2021-11-12: 8 mL via INTRAVENOUS

## 2021-11-25 NOTE — Progress Notes (Signed)
[facility]  [address] [place],  [place]  [address] [phone]  Clinic Day:  11/26/2021  Referring physician: Orrin Brigham, MD  HISTORY OF PRESENT ILLNESS:  The patient is a 61 y.o. 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  She is taking adjuvant Perjeta/Herceptin, which is being given once every 3 weeks for a full year.  She presents today prior to her 16th cycle of adjuvant therapy. However, the patient recently had an abnormal mammogram and breast MRI.  An initial biopsy revealed DCIS.  A second biopsy of the suspicious area in question did not reveal any malignant cells.  Of note her recent breast MRI on show an abnormal area of enhancement there is been 6.4 x 6.1 cm.  Despite her history of breast cancer in the same breast, the patient still wishes to undergo breast conservation surgery and stated being considered for a mastectomy.  It appears her main concern is that either a mastectomy or reconstructive breast surgery could lead to prolonged rehabilitation.  Of note, she is scheduled for her extended lumpectomy in early June 2023.  PHYSICAL EXAM:  Blood pressure 115/62, pulse 62, temperature 98.3 F (36.8 C), resp. rate 14, height 5' 4"  (1.626 m), weight 190 lb 6.4 oz (86.4 kg), last menstrual period 10/25/2015, SpO2 98 %. Wt Readings from Last 3 Encounters:  11/26/21 190 lb 6.4 oz (86.4 kg)  11/06/21 188 lb (85.3 kg)  10/16/21 190 lb (86.2 kg)   Body mass index is 32.68 kg/m. Performance status (ECOG): 0 - Asymptomatic Physical Exam Constitutional:      General: She is not in acute distress.    Appearance: Normal appearance. She is normal weight.  HENT:     Head: Normocephalic and atraumatic.     Mouth/Throat:     Pharynx: Oropharynx is clear. No oropharyngeal exudate.  Eyes:     General: No scleral icterus.    Extraocular Movements: Extraocular movements intact.     Conjunctiva/sclera: Conjunctivae normal.      Pupils: Pupils are equal, round, and reactive to light.  Cardiovascular:     Rate and Rhythm: Normal rate and regular rhythm.     Pulses: Normal pulses.     Heart sounds: Normal heart sounds. No murmur heard.   No friction rub. No gallop.  Pulmonary:     Effort: Pulmonary effort is normal. No respiratory distress.     Breath sounds: Normal breath sounds.  Chest:  Breasts:    Right: Mass (A 2 cm area of fullness at the site of her most recent biopsy) present. No swelling, bleeding, inverted nipple, nipple discharge or skin change.     Left: No swelling, bleeding, inverted nipple, mass, nipple discharge or skin change.  Abdominal:     General: Bowel sounds are normal. There is no distension.     Palpations: Abdomen is soft. There is no hepatomegaly, splenomegaly or mass.     Tenderness: There is no abdominal tenderness.  Musculoskeletal:        General: No tenderness. Normal range of motion.     Cervical back: Normal range of motion and neck supple.     Right lower leg: No edema.     Left lower leg: No edema.  Lymphadenopathy:     Cervical: No cervical adenopathy.     Right cervical: No superficial, deep or posterior cervical adenopathy.    Left cervical: No superficial, deep or posterior cervical adenopathy.  Upper Body:     Right upper body: No supraclavicular or axillary adenopathy.     Left upper body: No supraclavicular or axillary adenopathy.     Lower Body: No right inguinal adenopathy. No left inguinal adenopathy.  Skin:    General: Skin is warm and dry.     Coloration: Skin is not jaundiced.     Findings: No lesion or rash.  Neurological:     General: No focal deficit present.     Mental Status: She is alert and oriented to person, place, and time. Mental status is at baseline.  Psychiatric:        Mood and Affect: Mood normal.        Behavior: Behavior normal.        Thought Content: Thought content normal.        Judgment: Judgment normal.   LABS:  Latest  Reference Range & Units 11/26/21 00:00  Sodium 137 - 147  143 (E)  Potassium 3.5 - 5.1 mEq/L 4.3 (E)  Chloride 99 - 108  107 (E)  CO2 13 - 22  27 ! (E)  Glucose  103 (E)  BUN 4 - 21  16 (E)  Creatinine 0.5 - 1.1  0.9 (E)  Calcium 8.7 - 10.7  9.0 (E)  Magnesium  2.20 (E)  Alkaline Phosphatase 25 - 125  67 (E)  Albumin 3.5 - 5.0  4.2 (E)  AST 13 - 35  27 (E)  ALT 7 - 35 U/L 24 (E)  Bilirubin, Total  0.6 (E)  WBC  4.2 (E)  RBC 3.87 - 5.11  4.58 (E)  Hemoglobin 12.0 - 16.0  13.0 (E)  HCT 36 - 46  40 (E)  Platelets 150 - 400 K/uL 182 (E)  NEUT#  2.31 (E)  !: Data is abnormal (E): External lab result   ASSESSMENT & PLAN:  A 61 y.o. 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  She will proceed with cycle 16 of Herceptin/Perjeta tomorrow.  As mentioned previously, a recent right breast biopsy did reveal a new area of DCIS in her right breast.  Although a second biopsy of this area was negative, the fact that her breast MRI showed a 6.4 x 6.1 cm area of irregularity has me very concerned that she may ultimately may need more than just a lumpectomy to eradicate all disease in her right breast.  Personally, I feel the patient needs to undergo a completion mastectomy.  She definitely does not appear to be interested in this.  I also discussed with the patient having her see a reconstructive breast surgeon, who could talk with both her and Dr. Lilia Pro to where a consensus can be reached as to what is the best route to take for her breast cancer management.  As things stand currently, she will stick with her current thinking of undergoing an extended lumpectomy.  I will see her back in 6 weeks.  At that time, she will be heading into her 18th and final cycle of maintenance HER2 therapy.  I will also go over her pathology from her upcoming breast surgery at her next visit.  The patient understands all the plans discussed today and knows  to contact our office before then if she has additional questions regarding her disease management.     Kurstin Dimarzo Macarthur Critchley, MD

## 2021-11-26 ENCOUNTER — Other Ambulatory Visit: Payer: Self-pay | Admitting: Oncology

## 2021-11-26 ENCOUNTER — Inpatient Hospital Stay (INDEPENDENT_AMBULATORY_CARE_PROVIDER_SITE_OTHER): Payer: Managed Care, Other (non HMO) | Admitting: Oncology

## 2021-11-26 ENCOUNTER — Inpatient Hospital Stay: Payer: Managed Care, Other (non HMO)

## 2021-11-26 ENCOUNTER — Telehealth: Payer: Self-pay | Admitting: Oncology

## 2021-11-26 VITALS — BP 115/62 | HR 62 | Temp 98.3°F | Resp 14 | Ht 64.0 in | Wt 190.4 lb

## 2021-11-26 DIAGNOSIS — Z171 Estrogen receptor negative status [ER-]: Secondary | ICD-10-CM | POA: Diagnosis not present

## 2021-11-26 DIAGNOSIS — C50411 Malignant neoplasm of upper-outer quadrant of right female breast: Secondary | ICD-10-CM | POA: Diagnosis not present

## 2021-11-26 LAB — CBC AND DIFFERENTIAL
HCT: 40 (ref 36–46)
Hemoglobin: 13 (ref 12.0–16.0)
Neutrophils Absolute: 2.31
Platelets: 182 10*3/uL (ref 150–400)
WBC: 4.2

## 2021-11-26 LAB — BASIC METABOLIC PANEL
BUN: 16 (ref 4–21)
CO2: 27 — AB (ref 13–22)
Chloride: 107 (ref 99–108)
Creatinine: 0.9 (ref 0.5–1.1)
Glucose: 103
Potassium: 4.3 mEq/L (ref 3.5–5.1)
Sodium: 143 (ref 137–147)

## 2021-11-26 LAB — CBC: RBC: 4.58 (ref 3.87–5.11)

## 2021-11-26 LAB — COMPREHENSIVE METABOLIC PANEL
Albumin: 4.2 (ref 3.5–5.0)
Calcium: 9 (ref 8.7–10.7)

## 2021-11-26 LAB — HEPATIC FUNCTION PANEL
ALT: 24 U/L (ref 7–35)
AST: 27 (ref 13–35)
Alkaline Phosphatase: 67 (ref 25–125)
Bilirubin, Total: 0.6

## 2021-11-26 LAB — MAGNESIUM: Magnesium: 2.2

## 2021-11-26 MED FILL — Pertuzumab Soln for IV Infusion 420 MG/14ML (30 MG/ML): INTRAVENOUS | Qty: 14 | Status: AC

## 2021-11-26 MED FILL — Trastuzumab-anns For IV Soln 150 MG: INTRAVENOUS | Qty: 25 | Status: AC

## 2021-11-26 NOTE — Telephone Encounter (Signed)
Per 11/26/21 los next appt scheduled and confirmed with patient 

## 2021-11-27 ENCOUNTER — Inpatient Hospital Stay: Payer: Managed Care, Other (non HMO)

## 2021-11-27 VITALS — BP 113/59 | HR 72 | Temp 97.9°F | Resp 16 | Ht 64.0 in | Wt 192.0 lb

## 2021-11-27 DIAGNOSIS — Z5112 Encounter for antineoplastic immunotherapy: Secondary | ICD-10-CM | POA: Diagnosis not present

## 2021-11-27 DIAGNOSIS — Z171 Estrogen receptor negative status [ER-]: Secondary | ICD-10-CM

## 2021-11-27 MED ORDER — DIPHENHYDRAMINE HCL 25 MG PO CAPS: 50.0000 mg | ORAL_CAPSULE | Freq: Once | ORAL | Status: DC

## 2021-11-27 MED ORDER — HEPARIN SOD (PORK) LOCK FLUSH 100 UNIT/ML IV SOLN
500.0000 [IU] | Freq: Once | INTRAVENOUS | Status: AC | PRN
  Administered 2021-11-27: 500 [IU]

## 2021-11-27 MED ORDER — TRASTUZUMAB-ANNS CHEMO 150 MG IV SOLR
6.0000 mg/kg | Freq: Once | INTRAVENOUS | Status: AC
  Administered 2021-11-27: 525 mg via INTRAVENOUS
  Filled 2021-11-27: qty 20

## 2021-11-27 MED ORDER — SODIUM CHLORIDE 0.9% FLUSH
10.0000 mL | INTRAVENOUS | Status: DC | PRN
  Administered 2021-11-27: 10 mL

## 2021-11-27 MED ORDER — SODIUM CHLORIDE 0.9 % IV SOLN
420.0000 mg | Freq: Once | INTRAVENOUS | Status: AC
  Administered 2021-11-27: 420 mg via INTRAVENOUS
  Filled 2021-11-27: qty 14

## 2021-11-27 MED ORDER — SODIUM CHLORIDE 0.9 % IV SOLN: Freq: Once | INTRAVENOUS | Status: AC

## 2021-11-27 MED ORDER — ACETAMINOPHEN 325 MG PO TABS: 650.0000 mg | ORAL_TABLET | Freq: Once | ORAL | Status: DC

## 2021-11-27 NOTE — Patient Instructions (Signed)
[facility]  Discharge Instructions: Thank you for choosing [facility] to provide your oncology and hematology care.  If you have a lab appointment with the [facility], please go directly to the [facility] and check in at the registration area.   Wear comfortable clothing and clothing appropriate for easy access to any Portacath or PICC line.   We strive to give you quality time with your provider. You may need to reschedule your appointment if you arrive late (15 or more minutes).  Arriving late affects you and other patients whose appointments are after yours.  Also, if you miss three or more appointments without notifying the office, you may be dismissed from the clinic at the provider's discretion.      For prescription refill requests, have your pharmacy contact our office and allow 72 hours for refills to be completed.    Today you received the following chemotherapy and/or immunotherapy agents:Kanjinti and PerjetaPertuzumab injection What is this medication? PERTUZUMAB (per TOOZ ue mab) is a monoclonal antibody. It is used to treat breast cancer. This medicine may be used for other purposes; ask your health care provider or pharmacist if you have questions. COMMON BRAND NAME(S): PERJETA What should I tell my care team before I take this medication? They need to know if you have any of these conditions: heart disease heart failure high blood pressure history of irregular heart beat recent or ongoing radiation therapy an unusual or allergic reaction to pertuzumab, other medicines, foods, dyes, or preservatives pregnant or trying to get pregnant breast-feeding How should I use this medication? This medicine is for infusion into a vein. It is given by a health care professional in a hospital or clinic setting. Talk to your pediatrician regarding the use of this medicine in children. Special care may be needed. Overdosage: If you think you  have taken too much of this medicine contact a poison control center or emergency room at once. NOTE: This medicine is only for you. Do not share this medicine with others. What if I miss a dose? It is important not to miss your dose. Call your doctor or health care professional if you are unable to keep an appointment. What may interact with this medication? Interactions are not expected. Give your health care provider a list of all the medicines, herbs, non-prescription drugs, or dietary supplements you use. Also tell them if you smoke, drink alcohol, or use illegal drugs. Some items may interact with your medicine. This list may not describe all possible interactions. Give your health care provider a list of all the medicines, herbs, non-prescription drugs, or dietary supplements you use. Also tell them if you smoke, drink alcohol, or use illegal drugs. Some items may interact with your medicine. What should I watch for while using this medication? Your condition will be monitored carefully while you are receiving this medicine. Report any side effects. Continue your course of treatment even though you feel ill unless your doctor tells you to stop. Do not become pregnant while taking this medicine or for 7 months after stopping it. Women should inform their doctor if they wish to become pregnant or think they might be pregnant. Women of child-bearing potential will need to have a negative pregnancy test before starting this medicine. There is a potential for serious side effects to an unborn child. Talk to your health care professional or pharmacist for more information. Do not breast-feed an infant while taking this medicine or  for 7 months after stopping it. Women must use effective birth control with this medicine. Call your doctor or health care professional for advice if you get a fever, chills or sore throat, or other symptoms of a cold or flu. Do not treat yourself. Try to avoid being around  people who are sick. You may experience fever, chills, and headache during the infusion. Report any side effects during the infusion to your health care professional. What side effects may I notice from receiving this medication? Side effects that you should report to your doctor or health care professional as soon as possible: breathing problems chest pain or palpitations dizziness feeling faint or lightheaded fever or chills skin rash, itching or hives sore throat swelling of the face, lips, or tongue swelling of the legs or ankles unusually weak or tired Side effects that usually do not require medical attention (report to your doctor or health care professional if they continue or are bothersome): diarrhea hair loss nausea, vomiting tiredness This list may not describe all possible side effects. Call your doctor for medical advice about side effects. You may report side effects to FDA at 1-800-FDA-1088. Where should I keep my medication? This drug is given in a hospital or clinic and will not be stored at home. NOTE: This sheet is a summary. It may not cover all possible information. If you have questions about this medicine, talk to your doctor, pharmacist, or health care provider.  2023 Elsevier/Gold Standard (2015-07-27 00:00:00) Trastuzumab injection for infusion What is this medication? TRASTUZUMAB (tras TOO zoo mab) is a monoclonal antibody. It is used to treat breast cancer and stomach cancer. This medicine may be used for other purposes; ask your health care provider or pharmacist if you have questions. COMMON BRAND NAME(S): Herceptin, Janae Bridgeman, Ontruzant, Trazimera What should I tell my care team before I take this medication? They need to know if you have any of these conditions: heart disease heart failure lung or breathing disease, like asthma an unusual or allergic reaction to trastuzumab, benzyl alcohol, or other medications, foods, dyes, or  preservatives pregnant or trying to get pregnant breast-feeding How should I use this medication? This drug is given as an infusion into a vein. It is administered in a hospital or clinic by a specially trained health care professional. Talk to your pediatrician regarding the use of this medicine in children. This medicine is not approved for use in children. Overdosage: If you think you have taken too much of this medicine contact a poison control center or emergency room at once. NOTE: This medicine is only for you. Do not share this medicine with others. What if I miss a dose? It is important not to miss a dose. Call your doctor or health care professional if you are unable to keep an appointment. What may interact with this medication? This medicine may interact with the following medications: certain types of chemotherapy, such as daunorubicin, doxorubicin, epirubicin, and idarubicin This list may not describe all possible interactions. Give your health care provider a list of all the medicines, herbs, non-prescription drugs, or dietary supplements you use. Also tell them if you smoke, drink alcohol, or use illegal drugs. Some items may interact with your medicine. What should I watch for while using this medication? Visit your doctor for checks on your progress. Report any side effects. Continue your course of treatment even though you feel ill unless your doctor tells you to stop. Call your doctor or health care professional  for advice if you get a fever, chills or sore throat, or other symptoms of a cold or flu. Do not treat yourself. Try to avoid being around people who are sick. You may experience fever, chills and shaking during your first infusion. These effects are usually mild and can be treated with other medicines. Report any side effects during the infusion to your health care professional. Fever and chills usually do not happen with later infusions. Do not become pregnant while  taking this medicine or for 7 months after stopping it. Women should inform their doctor if they wish to become pregnant or think they might be pregnant. Women of child-bearing potential will need to have a negative pregnancy test before starting this medicine. There is a potential for serious side effects to an unborn child. Talk to your health care professional or pharmacist for more information. Do not breast-feed an infant while taking this medicine or for 7 months after stopping it. Women must use effective birth control with this medicine. What side effects may I notice from receiving this medication? Side effects that you should report to your doctor or health care professional as soon as possible: allergic reactions like skin rash, itching or hives, swelling of the face, lips, or tongue chest pain or palpitations cough dizziness feeling faint or lightheaded, falls fever general ill feeling or flu-like symptoms signs of worsening heart failure like breathing problems; swelling in your legs and feet unusually weak or tired Side effects that usually do not require medical attention (report to your doctor or health care professional if they continue or are bothersome): bone pain changes in taste diarrhea joint pain nausea/vomiting weight loss This list may not describe all possible side effects. Call your doctor for medical advice about side effects. You may report side effects to FDA at 1-800-FDA-1088. Where should I keep my medication? This drug is given in a hospital or clinic and will not be stored at home. NOTE: This sheet is a summary. It may not cover all possible information. If you have questions about this medicine, talk to your doctor, pharmacist, or health care provider.  2023 Elsevier/Gold Standard (2016-07-09 00:00:00)       To help prevent nausea and vomiting after your treatment, we encourage you to take your nausea medication as directed.  BELOW ARE SYMPTOMS THAT  SHOULD BE REPORTED IMMEDIATELY: *FEVER GREATER THAN 100.4 F (38 C) OR HIGHER *CHILLS OR SWEATING *NAUSEA AND VOMITING THAT IS NOT CONTROLLED WITH YOUR NAUSEA MEDICATION *UNUSUAL SHORTNESS OF BREATH *UNUSUAL BRUISING OR BLEEDING *URINARY PROBLEMS (pain or burning when urinating, or frequent urination) *BOWEL PROBLEMS (unusual diarrhea, constipation, pain near the anus) TENDERNESS IN MOUTH AND THROAT WITH OR WITHOUT PRESENCE OF ULCERS (sore throat, sores in mouth, or a toothache) UNUSUAL RASH, SWELLING OR PAIN  UNUSUAL VAGINAL DISCHARGE OR ITCHING   Items with * indicate a potential emergency and should be followed up as soon as possible or go to the Emergency Department if any problems should occur.  Please show the CHEMOTHERAPY ALERT CARD or IMMUNOTHERAPY ALERT CARD at check-in to the Emergency Department and triage nurse.  Should you have questions after your visit or need to cancel or reschedule your appointment, please contact [facility]  Dept: [phone]  and follow the prompts.  Office hours are 8:00 a.m. to 4:30 p.m. Monday - Friday. Please note that voicemails left after 4:00 p.m. may not be returned until the following business day.  We are closed weekends  and major holidays. You have access to a nurse at all times for urgent questions. Please call the main number to the clinic Dept: [phone] and follow the prompts.  For any non-urgent questions, you may also contact your provider using MyChart. We now offer e-Visits for anyone 23 and older to request care online for non-urgent symptoms. For details visit [email or web address].   Also download the MyChart app! Go to the app store, search "MyChart", open the app, select [facility], and log in with your MyChart username and password.  Due to Covid, a mask is required upon entering the hospital/clinic. If you do not have a mask, one will be given to you upon arrival. For doctor visits, patients may have  1 support person aged 54 or older with them. For treatment visits, patients cannot have anyone with them due to current Covid guidelines and our immunocompromised population.

## 2021-11-28 ENCOUNTER — Ambulatory Visit (INDEPENDENT_AMBULATORY_CARE_PROVIDER_SITE_OTHER): Payer: Managed Care, Other (non HMO) | Admitting: Plastic Surgery

## 2021-11-28 VITALS — BP 125/63 | HR 82 | Ht 64.0 in | Wt 191.2 lb

## 2021-11-28 DIAGNOSIS — Z171 Estrogen receptor negative status [ER-]: Secondary | ICD-10-CM | POA: Diagnosis not present

## 2021-11-28 DIAGNOSIS — C50411 Malignant neoplasm of upper-outer quadrant of right female breast: Secondary | ICD-10-CM

## 2021-11-28 NOTE — Progress Notes (Signed)
Referring Provider Theresa Grammes, MD [address] [place],  [place] [address]   CC:  Chief Complaint  Patient presents with   Consult      Theresa Castro is an 61 y.o. female.  HPI: Patient presents to discuss options for breast reconstruction.  She initially had a right-sided breast cancer that was treated around a year ago.  She subsequently had lumpectomy and radiation.  She healed fine from that but unfortunately has had a recurrence that was picked up on her annual mammogram.  She is debating between mastectomy and lumpectomy.  She would like to discuss what could be done in regards to implant-based reconstruction.  She is not interested in autologous reconstruction.  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  She has a number of female family members who have had significant venous thromboembolic complications and she is chronically on Xarelto.  She has seen Dr. Lilia Castro as her breast surgeon.  She does not smoke and is not a diabetic.  She is anticipating that her right areola would have to be removed whether she has a lumpectomy or mastectomy.  She would like to overall be smaller than she is now.  No Known Allergies  Outpatient Encounter Medications as of 11/28/2021  Medication Sig   fluticasone (FLONASE) 50 MCG/ACT nasal spray Place 2 sprays into the nose daily.   ondansetron (ZOFRAN) 4 MG tablet Take 1 tablet (4 mg total) by mouth every 4 (four) hours as needed for nausea.   prochlorperazine (COMPAZINE) 10 MG tablet Take 1 tablet (10 mg total) by mouth every 6 (six) hours as needed for nausea or vomiting.   SUMAtriptan (IMITREX) 50 MG tablet Take 50 mg by mouth as needed.   XARELTO 20 MG TABS tablet Take 20 mg by mouth daily.   zolpidem (AMBIEN) 10 MG tablet Take 1 tablet (10 mg total) by mouth at bedtime as needed for sleep.   [DISCONTINUED] apixaban (ELIQUIS) 5 MG TABS tablet Take 5 mg by mouth 2 (two) times daily.    [DISCONTINUED] azithromycin (ZITHROMAX Z-PAK) 250 MG tablet Take as directed on packet label   [DISCONTINUED] buPROPion (WELLBUTRIN XL) 300 MG 24 hr tablet Take 300 mg by mouth daily.   [DISCONTINUED] Dermatological Products, Misc. (STRATA XRT) GEL SMARTSIG:Sparingly Topical Twice Daily   [DISCONTINUED] ondansetron (ZOFRAN) 8 MG tablet Take 8 mg by mouth every 8 (eight) hours as needed.   [DISCONTINUED] oxyCODONE (OXY IR/ROXICODONE) 5 MG immediate release tablet Take 5 mg by mouth every 4 (four) hours as needed.   No facility-administered encounter medications on file as of 11/28/2021.     Past Medical History:  Diagnosis Date   Anemia    Anxiety    Arthritis    wrists - tx with otc med prn   Breast cancer ([facility])    Depression    DVT (deep venous thrombosis) ([facility])    Kidney stones    no surgery required - passed stones   Leiomyoma    Migraines    Pulmonary embolism ([facility]) 1989   after c/s POST PARTUM   Seasonal allergies     Past Surgical History:  Procedure Laterality Date   CESAREAN SECTION     X3   DILATATION & CURETTAGE/HYSTEROSCOPY WITH TRUECLEAR N/A 05/10/2013   Procedure: DILATATION & CURETTAGE/HYSTEROSCOPY WITH TRUECLEAR ;  Surgeon: Theresa Auerbach, MD;  Location: [facility];  Service: Gynecology;  Laterality: N/A;   TUBAL  LIGATION     WISDOM TOOTH EXTRACTION      Family History  Problem Relation Age of Onset   Stroke Mother    Deep vein thrombosis Mother    Pulmonary embolism Mother    Cancer Maternal Grandfather        Throat cancer    Social History   Social History Narrative   Not on file     Review of Systems General: Denies fevers, chills, weight loss CV: Denies chest pain, shortness of breath, palpitations  Physical Exam    11/28/2021    3:39 PM 11/27/2021    1:10 PM 11/26/2021    9:59 AM  Vitals with BMI  Height '5\' 4"'$  '5\' 4"'$  '5\' 4"'$   Weight 191 lbs 3 oz 192 lbs 190 lbs 6 oz  BMI 32.8 25.95 63.87  Systolic 564 332 951  Diastolic 63 59 62  Pulse  82 72 62    General:  No acute distress,  Alert and oriented, Non-Toxic, Normal speech and affect Breast: She remains reasonably symmetric.  Not much in the way of radiation changes that are obvious on exam.  Well-healed curvilinear upper outer quadrant scar on the right breast from the lumpectomy.  Looks to be about a D cup in volume.  Assessment/Plan Patient would be a reasonable candidate for immediate reconstruction with tissue expander if she elects to undergo mastectomy.  She is still on the fence.  I explained the tissue expander to implant staged reconstruction process.  Given her history of radiation I do not think she would be the best candidate for direct implant.  We reviewed risks include bleeding, infection, damage to surrounding structures.  We did review the need for drains.  We reviewed that wound healing complications or infections could result in loss of the expander or implant.  I do think she would be a good candidate for a left reduction for symmetry at the time of the implant expander exchange.  She is going to think about this but all of her questions were answered.  Theresa Castro 11/28/2021, 5:29 PM

## 2021-12-06 ENCOUNTER — Telehealth: Payer: Self-pay | Admitting: Plastic Surgery

## 2021-12-06 NOTE — Telephone Encounter (Signed)
Pam with Dr. Maxie Barb office returned my call to confirm that patient is not planning to have reconstruction at this time and that they have scheduled her for a lumpectomy on 12/13/2021. Provider notified of patient's decision.

## 2021-12-17 ENCOUNTER — Inpatient Hospital Stay: Payer: Managed Care, Other (non HMO) | Attending: Oncology

## 2021-12-17 ENCOUNTER — Other Ambulatory Visit: Payer: Self-pay

## 2021-12-17 DIAGNOSIS — Z5112 Encounter for antineoplastic immunotherapy: Secondary | ICD-10-CM | POA: Insufficient documentation

## 2021-12-17 DIAGNOSIS — C50411 Malignant neoplasm of upper-outer quadrant of right female breast: Secondary | ICD-10-CM

## 2021-12-17 DIAGNOSIS — C50911 Malignant neoplasm of unspecified site of right female breast: Secondary | ICD-10-CM | POA: Insufficient documentation

## 2021-12-17 DIAGNOSIS — Z170 Estrogen receptor positive status [ER+]: Secondary | ICD-10-CM | POA: Insufficient documentation

## 2021-12-17 DIAGNOSIS — C50419 Malignant neoplasm of upper-outer quadrant of unspecified female breast: Secondary | ICD-10-CM

## 2021-12-17 LAB — BASIC METABOLIC PANEL
BUN: 16 (ref 4–21)
CO2: 28 — AB (ref 13–22)
Chloride: 101 (ref 99–108)
Creatinine: 0.8 (ref 0.5–1.1)
Glucose: 95
Potassium: 4.2 mEq/L (ref 3.5–5.1)
Sodium: 140 (ref 137–147)

## 2021-12-17 LAB — CBC: RBC: 4.75 (ref 3.87–5.11)

## 2021-12-17 LAB — MAGNESIUM: Magnesium: 2.3

## 2021-12-17 LAB — CBC AND DIFFERENTIAL
HCT: 42 (ref 36–46)
Hemoglobin: 13.7 (ref 12.0–16.0)
Neutrophils Absolute: 3.34
Platelets: 226 10*3/uL (ref 150–400)
WBC: 5.3

## 2021-12-17 LAB — HEPATIC FUNCTION PANEL
ALT: 27 U/L (ref 7–35)
AST: 26 (ref 13–35)
Alkaline Phosphatase: 71 (ref 25–125)
Bilirubin, Total: 0.6

## 2021-12-17 LAB — COMPREHENSIVE METABOLIC PANEL
Albumin: 4.5 (ref 3.5–5.0)
Calcium: 9 (ref 8.7–10.7)

## 2021-12-18 ENCOUNTER — Encounter: Payer: Self-pay | Admitting: Oncology

## 2021-12-18 ENCOUNTER — Ambulatory Visit: Payer: Managed Care, Other (non HMO)

## 2021-12-18 ENCOUNTER — Inpatient Hospital Stay: Payer: Managed Care, Other (non HMO)

## 2021-12-18 VITALS — BP 130/71 | HR 81 | Temp 98.2°F | Resp 18 | Ht 64.0 in | Wt 191.0 lb

## 2021-12-18 DIAGNOSIS — C50911 Malignant neoplasm of unspecified site of right female breast: Secondary | ICD-10-CM | POA: Diagnosis present

## 2021-12-18 DIAGNOSIS — Z5112 Encounter for antineoplastic immunotherapy: Secondary | ICD-10-CM | POA: Diagnosis present

## 2021-12-18 DIAGNOSIS — C50419 Malignant neoplasm of upper-outer quadrant of unspecified female breast: Secondary | ICD-10-CM

## 2021-12-18 DIAGNOSIS — Z170 Estrogen receptor positive status [ER+]: Secondary | ICD-10-CM | POA: Diagnosis not present

## 2021-12-18 MED ORDER — SODIUM CHLORIDE 0.9 % IV SOLN: Freq: Once | INTRAVENOUS | Status: DC

## 2021-12-18 MED ORDER — DIPHENHYDRAMINE HCL 25 MG PO CAPS: 50.0000 mg | ORAL_CAPSULE | Freq: Once | ORAL | Status: DC

## 2021-12-18 MED ORDER — HEPARIN SOD (PORK) LOCK FLUSH 100 UNIT/ML IV SOLN: 500.0000 [IU] | Freq: Once | INTRAVENOUS | Status: DC | PRN

## 2021-12-18 MED ORDER — SODIUM CHLORIDE 0.9% FLUSH: 10.0000 mL | INTRAVENOUS | Status: DC | PRN

## 2021-12-18 MED ORDER — SODIUM CHLORIDE 0.9 % IV SOLN
420.0000 mg | Freq: Once | INTRAVENOUS | Status: AC
  Administered 2021-12-18: 420 mg via INTRAVENOUS
  Filled 2021-12-18: qty 14

## 2021-12-18 MED ORDER — TRASTUZUMAB-ANNS CHEMO 150 MG IV SOLR
6.0000 mg/kg | Freq: Once | INTRAVENOUS | Status: AC
  Administered 2021-12-18: 525 mg via INTRAVENOUS
  Filled 2021-12-18: qty 25

## 2021-12-18 MED ORDER — ACETAMINOPHEN 325 MG PO TABS: 650.0000 mg | ORAL_TABLET | Freq: Once | ORAL | Status: DC

## 2021-12-18 NOTE — Patient Instructions (Signed)
Pertuzumab injection What is this medication? PERTUZUMAB (per TOOZ ue mab) is a monoclonal antibody. It is used to treat breast cancer. This medicine may be used for other purposes; ask your health care provider or pharmacist if you have questions. COMMON BRAND NAME(S): PERJETA What should I tell my care team before I take this medication? They need to know if you have any of these conditions: heart disease heart failure high blood pressure history of irregular heart beat recent or ongoing radiation therapy an unusual or allergic reaction to pertuzumab, other medicines, foods, dyes, or preservatives pregnant or trying to get pregnant breast-feeding How should I use this medication? This medicine is for infusion into a vein. It is given by a health care professional in a hospital or clinic setting. Talk to your pediatrician regarding the use of this medicine in children. Special care may be needed. Overdosage: If you think you have taken too much of this medicine contact a poison control center or emergency room at once. NOTE: This medicine is only for you. Do not share this medicine with others. What if I miss a dose? It is important not to miss your dose. Call your doctor or health care professional if you are unable to keep an appointment. What may interact with this medication? Interactions are not expected. Give your health care provider a list of all the medicines, herbs, non-prescription drugs, or dietary supplements you use. Also tell them if you smoke, drink alcohol, or use illegal drugs. Some items may interact with your medicine. This list may not describe all possible interactions. Give your health care provider a list of all the medicines, herbs, non-prescription drugs, or dietary supplements you use. Also tell them if you smoke, drink alcohol, or use illegal drugs. Some items may interact with your medicine. What should I watch for while using this medication? Your condition  will be monitored carefully while you are receiving this medicine. Report any side effects. Continue your course of treatment even though you feel ill unless your doctor tells you to stop. Do not become pregnant while taking this medicine or for 7 months after stopping it. Women should inform their doctor if they wish to become pregnant or think they might be pregnant. Women of child-bearing potential will need to have a negative pregnancy test before starting this medicine. There is a potential for serious side effects to an unborn child. Talk to your health care professional or pharmacist for more information. Do not breast-feed an infant while taking this medicine or for 7 months after stopping it. Women must use effective birth control with this medicine. Call your doctor or health care professional for advice if you get a fever, chills or sore throat, or other symptoms of a cold or flu. Do not treat yourself. Try to avoid being around people who are sick. You may experience fever, chills, and headache during the infusion. Report any side effects during the infusion to your health care professional. What side effects may I notice from receiving this medication? Side effects that you should report to your doctor or health care professional as soon as possible: breathing problems chest pain or palpitations dizziness feeling faint or lightheaded fever or chills skin rash, itching or hives sore throat swelling of the face, lips, or tongue swelling of the legs or ankles unusually weak or tired Side effects that usually do not require medical attention (report to your doctor or health care professional if they continue or are bothersome): diarrhea hair loss   nausea, vomiting tiredness This list may not describe all possible side effects. Call your doctor for medical advice about side effects. You may report side effects to FDA at 1-800-FDA-1088. Where should I keep my medication? This drug is  given in a hospital or clinic and will not be stored at home. NOTE: This sheet is a summary. It may not cover all possible information. If you have questions about this medicine, talk to your doctor, pharmacist, or health care provider.  2023 Elsevier/Gold Standard (2015-07-27 00:00:00) Trastuzumab injection for infusion What is this medication? TRASTUZUMAB (tras TOO zoo mab) is a monoclonal antibody. It is used to treat breast cancer and stomach cancer. This medicine may be used for other purposes; ask your health care provider or pharmacist if you have questions. COMMON BRAND NAME(S): Herceptin, Herzuma, KANJINTI, Ogivri, Ontruzant, Trazimera What should I tell my care team before I take this medication? They need to know if you have any of these conditions: heart disease heart failure lung or breathing disease, like asthma an unusual or allergic reaction to trastuzumab, benzyl alcohol, or other medications, foods, dyes, or preservatives pregnant or trying to get pregnant breast-feeding How should I use this medication? This drug is given as an infusion into a vein. It is administered in a hospital or clinic by a specially trained health care professional. Talk to your pediatrician regarding the use of this medicine in children. This medicine is not approved for use in children. Overdosage: If you think you have taken too much of this medicine contact a poison control center or emergency room at once. NOTE: This medicine is only for you. Do not share this medicine with others. What if I miss a dose? It is important not to miss a dose. Call your doctor or health care professional if you are unable to keep an appointment. What may interact with this medication? This medicine may interact with the following medications: certain types of chemotherapy, such as daunorubicin, doxorubicin, epirubicin, and idarubicin This list may not describe all possible interactions. Give your health care  provider a list of all the medicines, herbs, non-prescription drugs, or dietary supplements you use. Also tell them if you smoke, drink alcohol, or use illegal drugs. Some items may interact with your medicine. What should I watch for while using this medication? Visit your doctor for checks on your progress. Report any side effects. Continue your course of treatment even though you feel ill unless your doctor tells you to stop. Call your doctor or health care professional for advice if you get a fever, chills or sore throat, or other symptoms of a cold or flu. Do not treat yourself. Try to avoid being around people who are sick. You may experience fever, chills and shaking during your first infusion. These effects are usually mild and can be treated with other medicines. Report any side effects during the infusion to your health care professional. Fever and chills usually do not happen with later infusions. Do not become pregnant while taking this medicine or for 7 months after stopping it. Women should inform their doctor if they wish to become pregnant or think they might be pregnant. Women of child-bearing potential will need to have a negative pregnancy test before starting this medicine. There is a potential for serious side effects to an unborn child. Talk to your health care professional or pharmacist for more information. Do not breast-feed an infant while taking this medicine or for 7 months after stopping it. Women must use   effective birth control with this medicine. What side effects may I notice from receiving this medication? Side effects that you should report to your doctor or health care professional as soon as possible: allergic reactions like skin rash, itching or hives, swelling of the face, lips, or tongue chest pain or palpitations cough dizziness feeling faint or lightheaded, falls fever general ill feeling or flu-like symptoms signs of worsening heart failure like breathing  problems; swelling in your legs and feet unusually weak or tired Side effects that usually do not require medical attention (report to your doctor or health care professional if they continue or are bothersome): bone pain changes in taste diarrhea joint pain nausea/vomiting weight loss This list may not describe all possible side effects. Call your doctor for medical advice about side effects. You may report side effects to FDA at 1-800-FDA-1088. Where should I keep my medication? This drug is given in a hospital or clinic and will not be stored at home. NOTE: This sheet is a summary. It may not cover all possible information. If you have questions about this medicine, talk to your doctor, pharmacist, or health care provider.  2023 Elsevier/Gold Standard (2016-07-09 00:00:00)  

## 2022-01-06 NOTE — Progress Notes (Signed)
[facility]  [address] [place],  [place]  [address] [phone]  Clinic Day:  01/07/2022  Referring physician: Orrin Brigham, MD  HISTORY OF PRESENT ILLNESS:  The patient is a 61 y.o. 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  This disease was found after abnormalities were seen with her surveillance mammogram and secondary MRI.  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  She comes in today to go over her pathologic results and their implications.  Since her second right breast lumpectomy was performed, the patient has been healing very well.  With respect to her stage IA Her2 positive breast cancer, she is taking adjuvant Perjeta/Herceptin, which is being given once every 3 weeks for a full year.  She is scheduled for her 18th and final cycle of adjuvant Perjeta/Herceptin this week.   PHYSICAL EXAM:  Blood pressure 113/68, pulse 71, temperature 98.8 F (37.1 C), resp. rate 16, height 5' 4"  (1.626 m), weight 189 lb 14.4 oz (86.1 kg), last menstrual period 10/25/2015, SpO2 97 %. Wt Readings from Last 3 Encounters:  01/07/22 189 lb 14.4 oz (86.1 kg)  12/18/21 191 lb (86.6 kg)  11/28/21 191 lb 3.2 oz (86.7 kg)   Body mass index is 32.6 kg/m. Performance status (ECOG): 0 - Asymptomatic Physical Exam Constitutional:      General: She is not in acute distress.    Appearance: Normal appearance. She is normal weight.  HENT:     Head: Normocephalic and atraumatic.     Mouth/Throat:     Pharynx: Oropharynx is clear. No oropharyngeal exudate.  Eyes:     General: No scleral icterus.    Extraocular Movements: Extraocular movements intact.     Conjunctiva/sclera: Conjunctivae normal.     Pupils: Pupils are equal, round, and reactive to light.  Cardiovascular:      Rate and Rhythm: Normal rate and regular rhythm.     Pulses: Normal pulses.     Heart sounds: Normal heart sounds. No murmur heard.    No friction rub. No gallop.  Pulmonary:     Effort: Pulmonary effort is normal. No respiratory distress.     Breath sounds: Normal breath sounds.  Chest:  Breasts:    Right: No swelling, bleeding, inverted nipple, mass, nipple discharge or skin change.     Left: No swelling, bleeding, inverted nipple, mass, nipple discharge or skin change.     Comments: Right breast has healed well from her recent lumpectomy Abdominal:     General: Bowel sounds are normal. There is no distension.     Palpations: Abdomen is soft. There is no hepatomegaly, splenomegaly or mass.     Tenderness: There is no abdominal tenderness.  Musculoskeletal:        General: No tenderness. Normal range of motion.     Cervical back: Normal range of motion and neck supple.     Right lower leg: No edema.     Left lower leg: No edema.  Lymphadenopathy:     Cervical: No cervical adenopathy.     Right cervical: No superficial, deep or posterior cervical adenopathy.    Left cervical: No superficial, deep or posterior cervical adenopathy.     Upper Body:     Right upper body: No supraclavicular or axillary adenopathy.  Left upper body: No supraclavicular or axillary adenopathy.     Lower Body: No right inguinal adenopathy. No left inguinal adenopathy.  Skin:    General: Skin is warm and dry.     Coloration: Skin is not jaundiced.     Findings: No lesion or rash.  Neurological:     General: No focal deficit present.     Mental Status: She is alert and oriented to person, place, and time. Mental status is at baseline.  Psychiatric:        Mood and Affect: Mood normal.        Behavior: Behavior normal.        Thought Content: Thought content normal.        Judgment: Judgment normal.     ASSESSMENT & PLAN:  A 61 y.o. female with stage 0 hormone positive breast cancer, also  known as ductal carcinoma in situ, status post a right breast lumpectomy in June 2023.  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  Based upon this, I will place her on tamoxifen, which she will take daily for 5 years of chemoprevention.  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  She is scheduled to receive her 18th and final cycle of adjuvant Herceptin/Perjeta this week.  Moving forward, her disease surveillance will consist of clinical breast exams every 4 to 6 months.  I will also repeat a mammogram in approximately 6 months to ensure there is no radiographic evidence of disease recurrence within her right breast.  Clinically, the patient appears to be doing well.  I will see her back in 4 months for repeat clinical assessment..  The patient understands all the plans discussed today and is in agreement with them.   Marsel Gail Macarthur Critchley, MD

## 2022-01-07 ENCOUNTER — Inpatient Hospital Stay: Payer: BC Managed Care – PPO | Attending: Oncology | Admitting: Oncology

## 2022-01-07 ENCOUNTER — Encounter: Payer: Self-pay | Admitting: Oncology

## 2022-01-07 ENCOUNTER — Other Ambulatory Visit: Payer: Self-pay | Admitting: Oncology

## 2022-01-07 ENCOUNTER — Inpatient Hospital Stay: Payer: BC Managed Care – PPO

## 2022-01-07 VITALS — BP 113/68 | HR 71 | Temp 98.8°F | Resp 16 | Ht 64.0 in | Wt 189.9 lb

## 2022-01-07 DIAGNOSIS — C50419 Malignant neoplasm of upper-outer quadrant of unspecified female breast: Secondary | ICD-10-CM | POA: Diagnosis not present

## 2022-01-07 DIAGNOSIS — D0511 Intraductal carcinoma in situ of right breast: Secondary | ICD-10-CM | POA: Diagnosis not present

## 2022-01-07 DIAGNOSIS — C50011 Malignant neoplasm of nipple and areola, right female breast: Secondary | ICD-10-CM | POA: Diagnosis not present

## 2022-01-07 DIAGNOSIS — Z171 Estrogen receptor negative status [ER-]: Secondary | ICD-10-CM

## 2022-01-07 DIAGNOSIS — Z170 Estrogen receptor positive status [ER+]: Secondary | ICD-10-CM | POA: Insufficient documentation

## 2022-01-07 DIAGNOSIS — D0510 Intraductal carcinoma in situ of unspecified breast: Secondary | ICD-10-CM | POA: Insufficient documentation

## 2022-01-07 DIAGNOSIS — C50411 Malignant neoplasm of upper-outer quadrant of right female breast: Secondary | ICD-10-CM | POA: Diagnosis not present

## 2022-01-07 DIAGNOSIS — C50911 Malignant neoplasm of unspecified site of right female breast: Secondary | ICD-10-CM | POA: Insufficient documentation

## 2022-01-07 DIAGNOSIS — Z5112 Encounter for antineoplastic immunotherapy: Secondary | ICD-10-CM | POA: Insufficient documentation

## 2022-01-07 DIAGNOSIS — Z79810 Long term (current) use of selective estrogen receptor modulators (SERMs): Secondary | ICD-10-CM | POA: Insufficient documentation

## 2022-01-07 LAB — BASIC METABOLIC PANEL
BUN: 13 (ref 4–21)
CO2: 24 — AB (ref 13–22)
Chloride: 108 (ref 99–108)
Creatinine: 0.8 (ref 0.5–1.1)
Glucose: 103
Potassium: 3.9 mEq/L (ref 3.5–5.1)
Sodium: 139 (ref 137–147)

## 2022-01-07 LAB — COMPREHENSIVE METABOLIC PANEL
Albumin: 4.3 (ref 3.5–5.0)
Calcium: 8.9 (ref 8.7–10.7)

## 2022-01-07 LAB — HEPATIC FUNCTION PANEL
ALT: 23 U/L (ref 7–35)
AST: 28 (ref 13–35)
Alkaline Phosphatase: 72 (ref 25–125)
Bilirubin, Total: 0.5

## 2022-01-07 LAB — CBC AND DIFFERENTIAL
HCT: 40 (ref 36–46)
Hemoglobin: 13.3 (ref 12.0–16.0)
Neutrophils Absolute: 2.24
Platelets: 207 10*3/uL (ref 150–400)
WBC: 4.4

## 2022-01-07 LAB — MAGNESIUM: Magnesium: 2.1

## 2022-01-07 LAB — CBC: RBC: 4.48 (ref 3.87–5.11)

## 2022-01-07 MED ORDER — TAMOXIFEN CITRATE 20 MG PO TABS: 20.0000 mg | ORAL_TABLET | Freq: Every day | ORAL | 3 refills | Status: DC

## 2022-01-07 MED FILL — Pertuzumab Soln for IV Infusion 420 MG/14ML (30 MG/ML): INTRAVENOUS | Qty: 14 | Status: AC

## 2022-01-07 MED FILL — Trastuzumab-anns For IV Soln 150 MG: INTRAVENOUS | Qty: 25 | Status: AC

## 2022-01-09 ENCOUNTER — Inpatient Hospital Stay: Payer: BC Managed Care – PPO

## 2022-01-09 VITALS — BP 130/73 | HR 72 | Temp 98.7°F | Resp 16 | Wt 191.1 lb

## 2022-01-09 DIAGNOSIS — Z171 Estrogen receptor negative status [ER-]: Secondary | ICD-10-CM

## 2022-01-09 DIAGNOSIS — C50911 Malignant neoplasm of unspecified site of right female breast: Secondary | ICD-10-CM | POA: Diagnosis not present

## 2022-01-09 DIAGNOSIS — Z5112 Encounter for antineoplastic immunotherapy: Secondary | ICD-10-CM | POA: Diagnosis not present

## 2022-01-09 DIAGNOSIS — Z79810 Long term (current) use of selective estrogen receptor modulators (SERMs): Secondary | ICD-10-CM | POA: Diagnosis not present

## 2022-01-09 DIAGNOSIS — Z170 Estrogen receptor positive status [ER+]: Secondary | ICD-10-CM | POA: Diagnosis not present

## 2022-01-09 MED ORDER — TRASTUZUMAB-ANNS CHEMO 150 MG IV SOLR
6.0000 mg/kg | Freq: Once | INTRAVENOUS | Status: AC
  Administered 2022-01-09: 525 mg via INTRAVENOUS
  Filled 2022-01-09: qty 25

## 2022-01-09 MED ORDER — SODIUM CHLORIDE 0.9 % IV SOLN
420.0000 mg | Freq: Once | INTRAVENOUS | Status: AC
  Administered 2022-01-09: 420 mg via INTRAVENOUS
  Filled 2022-01-09: qty 14

## 2022-01-09 MED ORDER — SODIUM CHLORIDE 0.9% FLUSH
10.0000 mL | INTRAVENOUS | Status: DC | PRN
  Administered 2022-01-09: 10 mL

## 2022-01-09 MED ORDER — DIPHENHYDRAMINE HCL 25 MG PO CAPS: 50.0000 mg | ORAL_CAPSULE | Freq: Once | ORAL | Status: DC

## 2022-01-09 MED ORDER — ACETAMINOPHEN 325 MG PO TABS: 650.0000 mg | ORAL_TABLET | Freq: Once | ORAL | Status: DC

## 2022-01-09 MED ORDER — SODIUM CHLORIDE 0.9 % IV SOLN: Freq: Once | INTRAVENOUS | Status: AC

## 2022-01-09 MED ORDER — HEPARIN SOD (PORK) LOCK FLUSH 100 UNIT/ML IV SOLN
500.0000 [IU] | Freq: Once | INTRAVENOUS | Status: AC | PRN
  Administered 2022-01-09: 500 [IU]

## 2022-01-09 NOTE — Patient Instructions (Signed)
[facility]  Discharge Instructions: Thank you for choosing [facility] to provide your oncology and hematology care.  If you have a lab appointment with the [facility], please go directly to the [facility] and check in at the registration area.   Wear comfortable clothing and clothing appropriate for easy access to any Portacath or PICC line.   We strive to give you quality time with your provider. You may need to reschedule your appointment if you arrive late (15 or more minutes).  Arriving late affects you and other patients whose appointments are after yours.  Also, if you miss three or more appointments without notifying the office, you may be dismissed from the clinic at the provider's discretion.      For prescription refill requests, have your pharmacy contact our office and allow 72 hours for refills to be completed.    Today you received the following chemotherapy and/or immunotherapy agents :Trastuzumab, PerjetaPertuzumab injection What is this medication? PERTUZUMAB (per TOOZ ue mab) is a monoclonal antibody. It is used to treat breast cancer. This medicine may be used for other purposes; ask your health care provider or pharmacist if you have questions. COMMON BRAND NAME(S): PERJETA What should I tell my care team before I take this medication? They need to know if you have any of these conditions: heart disease heart failure high blood pressure history of irregular heart beat recent or ongoing radiation therapy an unusual or allergic reaction to pertuzumab, other medicines, foods, dyes, or preservatives pregnant or trying to get pregnant breast-feeding How should I use this medication? This medicine is for infusion into a vein. It is given by a health care professional in a hospital or clinic setting. Talk to your pediatrician regarding the use of this medicine in children. Special care may be needed. Overdosage: If you think you  have taken too much of this medicine contact a poison control center or emergency room at once. NOTE: This medicine is only for you. Do not share this medicine with others. What if I miss a dose? It is important not to miss your dose. Call your doctor or health care professional if you are unable to keep an appointment. What may interact with this medication? Interactions are not expected. Give your health care provider a list of all the medicines, herbs, non-prescription drugs, or dietary supplements you use. Also tell them if you smoke, drink alcohol, or use illegal drugs. Some items may interact with your medicine. This list may not describe all possible interactions. Give your health care provider a list of all the medicines, herbs, non-prescription drugs, or dietary supplements you use. Also tell them if you smoke, drink alcohol, or use illegal drugs. Some items may interact with your medicine. What should I watch for while using this medication? Your condition will be monitored carefully while you are receiving this medicine. Report any side effects. Continue your course of treatment even though you feel ill unless your doctor tells you to stop. Do not become pregnant while taking this medicine or for 7 months after stopping it. Women should inform their doctor if they wish to become pregnant or think they might be pregnant. Women of child-bearing potential will need to have a negative pregnancy test before starting this medicine. There is a potential for serious side effects to an unborn child. Talk to your health care professional or pharmacist for more information. Do not breast-feed an infant while taking this medicine or  for 7 months after stopping it. Women must use effective birth control with this medicine. Call your doctor or health care professional for advice if you get a fever, chills or sore throat, or other symptoms of a cold or flu. Do not treat yourself. Try to avoid being around  people who are sick. You may experience fever, chills, and headache during the infusion. Report any side effects during the infusion to your health care professional. What side effects may I notice from receiving this medication? Side effects that you should report to your doctor or health care professional as soon as possible: breathing problems chest pain or palpitations dizziness feeling faint or lightheaded fever or chills skin rash, itching or hives sore throat swelling of the face, lips, or tongue swelling of the legs or ankles unusually weak or tired Side effects that usually do not require medical attention (report to your doctor or health care professional if they continue or are bothersome): diarrhea hair loss nausea, vomiting tiredness This list may not describe all possible side effects. Call your doctor for medical advice about side effects. You may report side effects to FDA at 1-800-FDA-1088. Where should I keep my medication? This drug is given in a hospital or clinic and will not be stored at home. NOTE: This sheet is a summary. It may not cover all possible information. If you have questions about this medicine, talk to your doctor, pharmacist, or health care provider.  2023 Elsevier/Gold Standard (2015-07-27 00:00:00) Trastuzumab injection for infusion What is this medication? TRASTUZUMAB (tras TOO zoo mab) is a monoclonal antibody. It is used to treat breast cancer and stomach cancer. This medicine may be used for other purposes; ask your health care provider or pharmacist if you have questions. COMMON BRAND NAME(S): Herceptin, Janae Bridgeman, Ontruzant, Trazimera What should I tell my care team before I take this medication? They need to know if you have any of these conditions: heart disease heart failure lung or breathing disease, like asthma an unusual or allergic reaction to trastuzumab, benzyl alcohol, or other medications, foods, dyes, or  preservatives pregnant or trying to get pregnant breast-feeding How should I use this medication? This drug is given as an infusion into a vein. It is administered in a hospital or clinic by a specially trained health care professional. Talk to your pediatrician regarding the use of this medicine in children. This medicine is not approved for use in children. Overdosage: If you think you have taken too much of this medicine contact a poison control center or emergency room at once. NOTE: This medicine is only for you. Do not share this medicine with others. What if I miss a dose? It is important not to miss a dose. Call your doctor or health care professional if you are unable to keep an appointment. What may interact with this medication? This medicine may interact with the following medications: certain types of chemotherapy, such as daunorubicin, doxorubicin, epirubicin, and idarubicin This list may not describe all possible interactions. Give your health care provider a list of all the medicines, herbs, non-prescription drugs, or dietary supplements you use. Also tell them if you smoke, drink alcohol, or use illegal drugs. Some items may interact with your medicine. What should I watch for while using this medication? Visit your doctor for checks on your progress. Report any side effects. Continue your course of treatment even though you feel ill unless your doctor tells you to stop. Call your doctor or health care professional  for advice if you get a fever, chills or sore throat, or other symptoms of a cold or flu. Do not treat yourself. Try to avoid being around people who are sick. You may experience fever, chills and shaking during your first infusion. These effects are usually mild and can be treated with other medicines. Report any side effects during the infusion to your health care professional. Fever and chills usually do not happen with later infusions. Do not become pregnant while  taking this medicine or for 7 months after stopping it. Women should inform their doctor if they wish to become pregnant or think they might be pregnant. Women of child-bearing potential will need to have a negative pregnancy test before starting this medicine. There is a potential for serious side effects to an unborn child. Talk to your health care professional or pharmacist for more information. Do not breast-feed an infant while taking this medicine or for 7 months after stopping it. Women must use effective birth control with this medicine. What side effects may I notice from receiving this medication? Side effects that you should report to your doctor or health care professional as soon as possible: allergic reactions like skin rash, itching or hives, swelling of the face, lips, or tongue chest pain or palpitations cough dizziness feeling faint or lightheaded, falls fever general ill feeling or flu-like symptoms signs of worsening heart failure like breathing problems; swelling in your legs and feet unusually weak or tired Side effects that usually do not require medical attention (report to your doctor or health care professional if they continue or are bothersome): bone pain changes in taste diarrhea joint pain nausea/vomiting weight loss This list may not describe all possible side effects. Call your doctor for medical advice about side effects. You may report side effects to FDA at 1-800-FDA-1088. Where should I keep my medication? This drug is given in a hospital or clinic and will not be stored at home. NOTE: This sheet is a summary. It may not cover all possible information. If you have questions about this medicine, talk to your doctor, pharmacist, or health care provider.  2023 Elsevier/Gold Standard (2016-07-09 00:00:00)       To help prevent nausea and vomiting after your treatment, we encourage you to take your nausea medication as directed.  BELOW ARE SYMPTOMS THAT  SHOULD BE REPORTED IMMEDIATELY: *FEVER GREATER THAN 100.4 F (38 C) OR HIGHER *CHILLS OR SWEATING *NAUSEA AND VOMITING THAT IS NOT CONTROLLED WITH YOUR NAUSEA MEDICATION *UNUSUAL SHORTNESS OF BREATH *UNUSUAL BRUISING OR BLEEDING *URINARY PROBLEMS (pain or burning when urinating, or frequent urination) *BOWEL PROBLEMS (unusual diarrhea, constipation, pain near the anus) TENDERNESS IN MOUTH AND THROAT WITH OR WITHOUT PRESENCE OF ULCERS (sore throat, sores in mouth, or a toothache) UNUSUAL RASH, SWELLING OR PAIN  UNUSUAL VAGINAL DISCHARGE OR ITCHING   Items with * indicate a potential emergency and should be followed up as soon as possible or go to the Emergency Department if any problems should occur.  Please show the CHEMOTHERAPY ALERT CARD or IMMUNOTHERAPY ALERT CARD at check-in to the Emergency Department and triage nurse.  Should you have questions after your visit or need to cancel or reschedule your appointment, please contact [facility]  Dept: [phone]  and follow the prompts.  Office hours are 8:00 a.m. to 4:30 p.m. Monday - Friday. Please note that voicemails left after 4:00 p.m. may not be returned until the following business day.  We are closed weekends  and major holidays. You have access to a nurse at all times for urgent questions. Please call the main number to the clinic Dept: [phone] and follow the prompts.  For any non-urgent questions, you may also contact your provider using MyChart. We now offer e-Visits for anyone 74 and older to request care online for non-urgent symptoms. For details visit [email or web address].   Also download the MyChart app! Go to the app store, search "MyChart", open the app, select [facility], and log in with your MyChart username and password.  Masks are optional in the cancer centers. If you would like for your care team to wear a mask while they are taking care of you, please let them know. For doctor visits,  patients may have with them one support person who is at least 60 years old. At this time, visitors are not allowed in the infusion area.

## 2022-01-16 ENCOUNTER — Telehealth: Payer: Self-pay

## 2022-01-16 NOTE — Telephone Encounter (Addendum)
Pt called to ask a couple of questions. First she wanted to know if Dr Bobby Rumpf would have a note sent to Dr Lilia Pro giving his permission to remove her port. Second, she wanted to know if there was a particular reason why Dr Bobby Rumpf chose tamoxifen, rather than an aromatase inhibitor?    Dr Bobby Rumpf is agreeable for pt to have port removed. He states, "Tamoxifen is the standard of care treatment for DCIS".  Pt notified of Dr Bobby Rumpf' response and verbalized understanding.

## 2022-02-15 DIAGNOSIS — Z452 Encounter for adjustment and management of vascular access device: Secondary | ICD-10-CM | POA: Diagnosis not present

## 2022-02-24 DIAGNOSIS — H6692 Otitis media, unspecified, left ear: Secondary | ICD-10-CM | POA: Diagnosis not present

## 2022-04-18 DIAGNOSIS — Z23 Encounter for immunization: Secondary | ICD-10-CM | POA: Diagnosis not present

## 2022-04-18 DIAGNOSIS — Z6832 Body mass index (BMI) 32.0-32.9, adult: Secondary | ICD-10-CM | POA: Diagnosis not present

## 2022-04-18 DIAGNOSIS — Z13220 Encounter for screening for lipoid disorders: Secondary | ICD-10-CM | POA: Diagnosis not present

## 2022-04-18 DIAGNOSIS — Z131 Encounter for screening for diabetes mellitus: Secondary | ICD-10-CM | POA: Diagnosis not present

## 2022-04-18 DIAGNOSIS — Z01419 Encounter for gynecological examination (general) (routine) without abnormal findings: Secondary | ICD-10-CM | POA: Diagnosis not present

## 2022-05-08 NOTE — Progress Notes (Signed)
[facility]  [address] [place],  [place]  [address] [phone]  Clinic Day:  05/09/2022  Referring physician: Orrin Brigham, MD  HISTORY OF PRESENT ILLNESS:  The patient is a 61 y.o. 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  She comes in today for routine follow-up.  Since her last visit, the patient has been doing fairly well.  She denies having any particular changes in either breast which concerns her for early disease recurrence.  PHYSICAL EXAM:  Blood pressure 116/68, pulse 74, temperature 98.1 F (36.7 C), resp. rate 14, height _0  (1.626 m), weight 187 lb 6.4 oz (85 kg), last menstrual period 10/25/2015, SpO2 97 %. Wt Readings from Last 3 Encounters:  05/09/22 187 lb 6.4 oz (85 kg)  01/09/22 191 lb 1.3 oz (86.7 kg)  01/07/22 189 lb 14.4 oz (86.1 kg)   Body mass index is 32.17 kg/m. Performance status (ECOG): 0 - Asymptomatic Physical Exam Constitutional:      General: She is not in acute distress.    Appearance: Normal appearance. She is normal weight.  HENT:     Head: Normocephalic and atraumatic.     Mouth/Throat:     Pharynx: Oropharynx is clear. No oropharyngeal exudate.  Eyes:     General: No scleral icterus.    Extraocular Movements: Extraocular movements intact.     Conjunctiva/sclera: Conjunctivae normal.     Pupils: Pupils are equal, round, and reactive to light.  Cardiovascular:     Rate and Rhythm: Normal rate and regular rhythm.     Pulses: Normal pulses.     Heart sounds: Normal heart sounds. No murmur heard.    No friction rub. No gallop.  Pulmonary:     Effort: Pulmonary effort is  normal. No respiratory distress.     Breath sounds: Normal breath sounds.  Chest:  Breasts:    Right: No swelling, bleeding, inverted nipple, mass, nipple discharge or skin change.     Left: No swelling, bleeding, inverted nipple, mass, nipple discharge or skin change.  Abdominal:     General: Bowel sounds are normal. There is no distension.     Palpations: Abdomen is soft. There is no hepatomegaly, splenomegaly or mass.     Tenderness: There is no abdominal tenderness.  Musculoskeletal:        General: No tenderness. Normal range of motion.     Cervical back: Normal range of motion and neck supple.     Right lower leg: No edema.     Left lower leg: No edema.  Lymphadenopathy:     Cervical: No cervical adenopathy.     Right cervical: No superficial, deep or posterior cervical adenopathy.    Left cervical: No superficial, deep or posterior cervical adenopathy.     Upper Body:     Right upper body: No supraclavicular or axillary adenopathy.     Left upper body: No supraclavicular or axillary adenopathy.     Lower Body: No right inguinal adenopathy. No left inguinal adenopathy.  Skin:    General: Skin is warm and dry.     Coloration: Skin is not jaundiced.  Findings: No lesion or rash.  Neurological:     General: No focal deficit present.     Mental Status: She is alert and oriented to person, place, and time. Mental status is at baseline.  Psychiatric:        Mood and Affect: Mood normal.        Behavior: Behavior normal.        Thought Content: Thought content normal.        Judgment: Judgment normal.    ASSESSMENT & PLAN:  A 61 y.o. female with hormone positive ductal carcinoma in situ, status post a right breast lumpectomy in June 2023.  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  She also underwent adjuvant breast radiation.  Based upon her clinical breast exam today, the patient remains disease-free.  She knows  to continue taking her tamoxifen on a daily basis for chemoprevention purposes as it pertains to her DCIS.  Clinically, the patient is doing very well.  I will see her back in 6 months for repeat clinical assessment.  Her mammogram will be scheduled before her next visit for her continued radiographic breast cancer surveillance.  The patient understands all the plans discussed today and is in agreement with them.   Margurete Guaman Macarthur Critchley, MD

## 2022-05-09 ENCOUNTER — Inpatient Hospital Stay: Payer: BC Managed Care – PPO | Attending: Oncology | Admitting: Oncology

## 2022-05-09 ENCOUNTER — Other Ambulatory Visit: Payer: Self-pay | Admitting: Oncology

## 2022-05-09 VITALS — BP 116/68 | HR 74 | Temp 98.1°F | Resp 14 | Ht 64.0 in | Wt 187.4 lb

## 2022-05-09 DIAGNOSIS — Z171 Estrogen receptor negative status [ER-]: Secondary | ICD-10-CM

## 2022-05-09 DIAGNOSIS — C50412 Malignant neoplasm of upper-outer quadrant of left female breast: Secondary | ICD-10-CM

## 2022-05-09 DIAGNOSIS — C50419 Malignant neoplasm of upper-outer quadrant of unspecified female breast: Secondary | ICD-10-CM

## 2022-05-10 ENCOUNTER — Ambulatory Visit: Payer: BC Managed Care – PPO | Admitting: Oncology

## 2022-05-23 DIAGNOSIS — D0511 Intraductal carcinoma in situ of right breast: Secondary | ICD-10-CM | POA: Diagnosis not present

## 2022-05-23 DIAGNOSIS — Z853 Personal history of malignant neoplasm of breast: Secondary | ICD-10-CM | POA: Diagnosis not present

## 2022-05-23 DIAGNOSIS — R92323 Mammographic fibroglandular density, bilateral breasts: Secondary | ICD-10-CM | POA: Diagnosis not present

## 2022-05-23 DIAGNOSIS — Z170 Estrogen receptor positive status [ER+]: Secondary | ICD-10-CM | POA: Diagnosis not present

## 2022-05-27 ENCOUNTER — Other Ambulatory Visit: Payer: Self-pay | Admitting: Oncology

## 2022-05-27 DIAGNOSIS — Z171 Estrogen receptor negative status [ER-]: Secondary | ICD-10-CM

## 2022-05-28 DIAGNOSIS — Z170 Estrogen receptor positive status [ER+]: Secondary | ICD-10-CM | POA: Diagnosis not present

## 2022-05-28 DIAGNOSIS — D0511 Intraductal carcinoma in situ of right breast: Secondary | ICD-10-CM | POA: Diagnosis not present

## 2022-05-28 DIAGNOSIS — C50411 Malignant neoplasm of upper-outer quadrant of right female breast: Secondary | ICD-10-CM | POA: Diagnosis not present

## 2022-07-19 DIAGNOSIS — D689 Coagulation defect, unspecified: Secondary | ICD-10-CM | POA: Diagnosis not present

## 2022-07-19 DIAGNOSIS — F324 Major depressive disorder, single episode, in partial remission: Secondary | ICD-10-CM | POA: Diagnosis not present

## 2022-07-19 DIAGNOSIS — G43709 Chronic migraine without aura, not intractable, without status migrainosus: Secondary | ICD-10-CM | POA: Diagnosis not present

## 2022-07-19 DIAGNOSIS — C50911 Malignant neoplasm of unspecified site of right female breast: Secondary | ICD-10-CM | POA: Diagnosis not present

## 2022-08-29 DIAGNOSIS — F4321 Adjustment disorder with depressed mood: Secondary | ICD-10-CM | POA: Diagnosis not present

## 2022-09-11 DIAGNOSIS — F4321 Adjustment disorder with depressed mood: Secondary | ICD-10-CM | POA: Diagnosis not present

## 2022-09-13 DIAGNOSIS — S92355A Nondisplaced fracture of fifth metatarsal bone, left foot, initial encounter for closed fracture: Secondary | ICD-10-CM | POA: Diagnosis not present

## 2022-09-16 DIAGNOSIS — S92355A Nondisplaced fracture of fifth metatarsal bone, left foot, initial encounter for closed fracture: Secondary | ICD-10-CM | POA: Diagnosis not present

## 2022-09-23 DIAGNOSIS — S92355A Nondisplaced fracture of fifth metatarsal bone, left foot, initial encounter for closed fracture: Secondary | ICD-10-CM | POA: Diagnosis not present

## 2022-09-24 DIAGNOSIS — F4322 Adjustment disorder with anxiety: Secondary | ICD-10-CM | POA: Diagnosis not present

## 2022-09-24 DIAGNOSIS — Z6829 Body mass index (BMI) 29.0-29.9, adult: Secondary | ICD-10-CM | POA: Diagnosis not present

## 2022-10-02 DIAGNOSIS — F4321 Adjustment disorder with depressed mood: Secondary | ICD-10-CM | POA: Diagnosis not present

## 2022-10-09 DIAGNOSIS — F4321 Adjustment disorder with depressed mood: Secondary | ICD-10-CM | POA: Diagnosis not present

## 2022-10-16 DIAGNOSIS — J309 Allergic rhinitis, unspecified: Secondary | ICD-10-CM | POA: Diagnosis not present

## 2022-10-16 DIAGNOSIS — F324 Major depressive disorder, single episode, in partial remission: Secondary | ICD-10-CM | POA: Diagnosis not present

## 2022-10-16 DIAGNOSIS — F410 Panic disorder [episodic paroxysmal anxiety] without agoraphobia: Secondary | ICD-10-CM | POA: Diagnosis not present

## 2022-10-16 DIAGNOSIS — R0982 Postnasal drip: Secondary | ICD-10-CM | POA: Diagnosis not present

## 2022-11-06 NOTE — Progress Notes (Unsigned)
[facility]  [address] [place],  [place]  [address] [phone]  Clinic Day:  11/07/2022  Referring physician: Marvis Moeller, MD  HISTORY OF PRESENT ILLNESS:  The patient is a 62 y.o. 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  The patient completed her 1 year of adjuvant Perjeta/Herceptin in July 2023.  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  She comes in today for routine follow-up.  Since her last visit, the patient has been doing fairly well.  She denies having any particular changes in either breast which concerns her for early disease recurrence.  PHYSICAL EXAM:  Blood pressure 114/67, pulse 72, temperature 99.2 F (37.3 C), resp. rate 16, height 5\' 4"  (1.626 m), weight 170 lb 3.2 oz (77.2 kg), last menstrual period 10/25/2015, SpO2 98 %. Wt Readings from Last 3 Encounters:  11/07/22 170 lb 3.2 oz (77.2 kg)  05/09/22 187 lb 6.4 oz (85 kg)  01/09/22 191 lb 1.3 oz (86.7 kg)   Body mass index is 29.21 kg/m. Performance status (ECOG): 0 - Asymptomatic Physical Exam Constitutional:      General: She is not in acute distress.    Appearance: Normal appearance. She is normal weight.  HENT:     Head: Normocephalic and atraumatic.     Mouth/Throat:     Pharynx: Oropharynx is clear. No oropharyngeal exudate.  Eyes:     General: No scleral icterus.    Extraocular Movements: Extraocular movements intact.     Conjunctiva/sclera: Conjunctivae normal.     Pupils: Pupils are equal, round, and reactive to light.  Cardiovascular:     Rate and Rhythm: Normal rate and regular rhythm.     Pulses: Normal pulses.     Heart sounds: Normal heart sounds. No murmur heard.    No friction rub. No gallop.  Pulmonary:     Effort: Pulmonary effort is  normal. No respiratory distress.     Breath sounds: Normal breath sounds.  Chest:  Breasts:    Right: No swelling, bleeding, inverted nipple, mass, nipple discharge or skin change.     Left: No swelling, bleeding, inverted nipple, mass, nipple discharge or skin change.     Comments: Absent right nipple Abdominal:     General: Bowel sounds are normal. There is no distension.     Palpations: Abdomen is soft. There is no hepatomegaly, splenomegaly or mass.     Tenderness: There is no abdominal tenderness.  Musculoskeletal:        General: No tenderness. Normal range of motion.     Cervical back: Normal range of motion and neck supple.     Right lower leg: No edema.     Left lower leg: No edema.  Lymphadenopathy:     Cervical: No cervical adenopathy.     Right cervical: No superficial, deep or posterior cervical adenopathy.    Left cervical: No superficial, deep or posterior cervical adenopathy.     Upper Body:     Right upper body: No supraclavicular or axillary adenopathy.     Left upper body: No supraclavicular or axillary adenopathy.     Lower Body: No right inguinal adenopathy. No left inguinal adenopathy.  Skin:    General: Skin is warm and dry.  Coloration: Skin is not jaundiced.     Findings: No lesion or rash.  Neurological:     General: No focal deficit present.     Mental Status: She is alert and oriented to person, place, and time. Mental status is at baseline.  Psychiatric:        Mood and Affect: Mood normal.        Behavior: Behavior normal.        Thought Content: Thought content normal.        Judgment: Judgment normal.    ASSESSMENT & PLAN:  A 62 y.o. female with hormone positive ductal carcinoma in situ, status post a right breast lumpectomy in June 2023.  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  She also underwent adjuvant breast radiation.  Based upon her clinical breast exam today, the patient  remains disease-free.  She knows to continue taking her tamoxifen on a daily basis for chemoprevention purposes as it pertains to her DCIS.  Clinically, the patient is doing very well.  I will see her back in 6 months for repeat clinical assessment.  Her breast MRI will be scheduled during this calendar month for her continued radiographic breast cancer surveillance.  Of note, a breast MRI is being ordered as it previously showed that she had areas of non-mass enhancement throughout her right breast that measured 6.4 x 6.1 cm.  This diffuse area of enhancement was never seen per her annual mammograms.  The patient understands all the plans discussed today and is in agreement with them.   Jenin Birdsall Kirby Funk, MD

## 2022-11-07 ENCOUNTER — Inpatient Hospital Stay: Payer: BC Managed Care – PPO | Attending: Oncology | Admitting: Oncology

## 2022-11-07 VITALS — BP 114/67 | HR 72 | Temp 99.2°F | Resp 16 | Ht 64.0 in | Wt 170.2 lb

## 2022-11-07 DIAGNOSIS — C50412 Malignant neoplasm of upper-outer quadrant of left female breast: Secondary | ICD-10-CM

## 2022-11-07 DIAGNOSIS — Z170 Estrogen receptor positive status [ER+]: Secondary | ICD-10-CM

## 2022-11-12 ENCOUNTER — Other Ambulatory Visit: Payer: Self-pay | Admitting: Oncology

## 2022-11-12 DIAGNOSIS — Z170 Estrogen receptor positive status [ER+]: Secondary | ICD-10-CM

## 2022-11-27 ENCOUNTER — Ambulatory Visit
Admission: RE | Admit: 2022-11-27 | Discharge: 2022-11-27 | Disposition: A | Discharge status: Home / Self Care | Payer: BC Managed Care – PPO | Source: Ambulatory Visit | Attending: Oncology | Admitting: Oncology

## 2022-11-27 DIAGNOSIS — Z170 Estrogen receptor positive status [ER+]: Secondary | ICD-10-CM

## 2022-11-27 DIAGNOSIS — Z1239 Encounter for other screening for malignant neoplasm of breast: Secondary | ICD-10-CM | POA: Diagnosis not present

## 2022-11-27 DIAGNOSIS — R222 Localized swelling, mass and lump, trunk: Secondary | ICD-10-CM | POA: Diagnosis not present

## 2022-11-27 DIAGNOSIS — D0511 Intraductal carcinoma in situ of right breast: Secondary | ICD-10-CM | POA: Diagnosis not present

## 2022-11-27 MED ORDER — GADOPICLENOL 0.5 MMOL/ML IV SOLN
8.0000 mL | Freq: Once | INTRAVENOUS | Status: AC | PRN
  Administered 2022-11-27: 8 mL via INTRAVENOUS

## 2022-11-28 ENCOUNTER — Other Ambulatory Visit: Payer: Self-pay | Admitting: Hematology and Oncology

## 2022-11-28 DIAGNOSIS — D0512 Intraductal carcinoma in situ of left breast: Secondary | ICD-10-CM

## 2022-11-28 DIAGNOSIS — D0511 Intraductal carcinoma in situ of right breast: Secondary | ICD-10-CM

## 2022-11-28 DIAGNOSIS — Z171 Estrogen receptor negative status [ER-]: Secondary | ICD-10-CM

## 2022-11-28 DIAGNOSIS — J9859 Other diseases of mediastinum, not elsewhere classified: Secondary | ICD-10-CM

## 2022-12-03 DIAGNOSIS — F4321 Adjustment disorder with depressed mood: Secondary | ICD-10-CM | POA: Diagnosis not present

## 2022-12-04 DIAGNOSIS — D0512 Intraductal carcinoma in situ of left breast: Secondary | ICD-10-CM | POA: Diagnosis not present

## 2022-12-04 DIAGNOSIS — J9859 Other diseases of mediastinum, not elsewhere classified: Secondary | ICD-10-CM | POA: Diagnosis not present

## 2022-12-04 DIAGNOSIS — C50911 Malignant neoplasm of unspecified site of right female breast: Secondary | ICD-10-CM | POA: Diagnosis not present

## 2022-12-04 LAB — CBC AND DIFFERENTIAL
HCT: 41 (ref 36–46)
Hemoglobin: 13.5 (ref 12.0–16.0)
Neutrophils Absolute: 2.77
Platelets: 201 10*3/uL (ref 150–400)
WBC: 4.7

## 2022-12-04 LAB — HEPATIC FUNCTION PANEL
ALT: 24 U/L (ref 7–35)
AST: 28 (ref 13–35)
Alkaline Phosphatase: 47 (ref 25–125)
Bilirubin, Total: 0.4

## 2022-12-04 LAB — BASIC METABOLIC PANEL
BUN: 13 (ref 4–21)
CO2: 25 — AB (ref 13–22)
Chloride: 107 (ref 99–108)
Creatinine: 0.8 (ref 0.5–1.1)
EGFR (Non-African Amer.): >60
Glucose: 93
Potassium: 4.8 mEq/L (ref 3.5–5.1)
Sodium: 143 (ref 137–147)

## 2022-12-04 LAB — CBC: RBC: 4.32 (ref 3.87–5.11)

## 2022-12-04 LAB — COMPREHENSIVE METABOLIC PANEL
Albumin: 4.2 (ref 3.5–5.0)
Calcium: 9.6 (ref 8.7–10.7)

## 2022-12-10 ENCOUNTER — Other Ambulatory Visit: Payer: Self-pay | Admitting: Hematology and Oncology

## 2022-12-10 ENCOUNTER — Telehealth: Payer: Self-pay

## 2022-12-10 DIAGNOSIS — E041 Nontoxic single thyroid nodule: Secondary | ICD-10-CM

## 2022-12-10 NOTE — Telephone Encounter (Signed)
Pt LVM on nurse line req someone to explain CT results (she saw on MyChart). She wants to know if she needs to get the ultrasound and biopsy scheduled (recommended as clinically warranted per impression)?

## 2022-12-17 DIAGNOSIS — F4321 Adjustment disorder with depressed mood: Secondary | ICD-10-CM | POA: Diagnosis not present

## 2022-12-18 ENCOUNTER — Encounter: Payer: Self-pay | Admitting: Hematology and Oncology

## 2022-12-19 DIAGNOSIS — E041 Nontoxic single thyroid nodule: Secondary | ICD-10-CM | POA: Diagnosis not present

## 2022-12-23 DIAGNOSIS — K5792 Diverticulitis of intestine, part unspecified, without perforation or abscess without bleeding: Secondary | ICD-10-CM | POA: Diagnosis not present

## 2022-12-23 DIAGNOSIS — Z6829 Body mass index (BMI) 29.0-29.9, adult: Secondary | ICD-10-CM | POA: Diagnosis not present

## 2022-12-25 DIAGNOSIS — K5792 Diverticulitis of intestine, part unspecified, without perforation or abscess without bleeding: Secondary | ICD-10-CM | POA: Diagnosis not present

## 2022-12-25 DIAGNOSIS — Z6830 Body mass index (BMI) 30.0-30.9, adult: Secondary | ICD-10-CM | POA: Diagnosis not present

## 2022-12-27 ENCOUNTER — Encounter: Payer: Self-pay | Admitting: Hematology and Oncology

## 2022-12-31 ENCOUNTER — Encounter: Payer: Self-pay | Admitting: Hematology and Oncology

## 2023-01-02 DIAGNOSIS — F4321 Adjustment disorder with depressed mood: Secondary | ICD-10-CM | POA: Diagnosis not present

## 2023-01-03 ENCOUNTER — Other Ambulatory Visit: Payer: Self-pay | Admitting: Oncology

## 2023-01-13 ENCOUNTER — Other Ambulatory Visit: Payer: Self-pay

## 2023-01-13 DIAGNOSIS — D0512 Intraductal carcinoma in situ of left breast: Secondary | ICD-10-CM

## 2023-01-13 MED ORDER — TAMOXIFEN CITRATE 20 MG PO TABS
20.0000 mg | ORAL_TABLET | Freq: Every day | ORAL | 3 refills | Status: DC
Start: 2023-01-13 — End: 2023-11-26

## 2023-03-03 DIAGNOSIS — F4321 Adjustment disorder with depressed mood: Secondary | ICD-10-CM | POA: Diagnosis not present

## 2023-03-24 DIAGNOSIS — F4321 Adjustment disorder with depressed mood: Secondary | ICD-10-CM | POA: Diagnosis not present

## 2023-04-21 DIAGNOSIS — Z1339 Encounter for screening examination for other mental health and behavioral disorders: Secondary | ICD-10-CM | POA: Diagnosis not present

## 2023-04-21 DIAGNOSIS — Z01419 Encounter for gynecological examination (general) (routine) without abnormal findings: Secondary | ICD-10-CM | POA: Diagnosis not present

## 2023-04-21 DIAGNOSIS — F4321 Adjustment disorder with depressed mood: Secondary | ICD-10-CM | POA: Diagnosis not present

## 2023-04-21 DIAGNOSIS — Z131 Encounter for screening for diabetes mellitus: Secondary | ICD-10-CM | POA: Diagnosis not present

## 2023-04-21 DIAGNOSIS — Z23 Encounter for immunization: Secondary | ICD-10-CM | POA: Diagnosis not present

## 2023-04-21 DIAGNOSIS — E041 Nontoxic single thyroid nodule: Secondary | ICD-10-CM | POA: Diagnosis not present

## 2023-04-21 DIAGNOSIS — Z1331 Encounter for screening for depression: Secondary | ICD-10-CM | POA: Diagnosis not present

## 2023-04-22 DIAGNOSIS — E041 Nontoxic single thyroid nodule: Secondary | ICD-10-CM | POA: Diagnosis not present

## 2023-05-13 DIAGNOSIS — D0511 Intraductal carcinoma in situ of right breast: Secondary | ICD-10-CM | POA: Diagnosis not present

## 2023-05-13 DIAGNOSIS — Z170 Estrogen receptor positive status [ER+]: Secondary | ICD-10-CM | POA: Diagnosis not present

## 2023-05-28 NOTE — Progress Notes (Unsigned)
[facility]  [address] [place],  [place]  [address] [phone]  Clinic Day:  11/07/2022  Referring physician: Marvis Moeller, MD  HISTORY OF PRESENT ILLNESS:  The patient is a 62 y.o. 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  The patient completed her 1 year of adjuvant Perjeta/Herceptin in July 2023.  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  She comes in today for routine follow-up.  Since her last visit, the patient has been doing fairly well.  She denies having any particular changes in either breast which concerns her for early disease recurrence.  Of note, her annual mammogram earlier this month showed no evidence of disease recurrence.    PHYSICAL EXAM:  Last menstrual period 10/25/2015. Wt Readings from Last 3 Encounters:  11/07/22 170 lb 3.2 oz (77.2 kg)  05/09/22 187 lb 6.4 oz (85 kg)  01/09/22 191 lb 1.3 oz (86.7 kg)   There is no height or weight on file to calculate BMI. Performance status (ECOG): 0 - Asymptomatic Physical Exam Constitutional:      General: She is not in acute distress.    Appearance: Normal appearance. She is normal weight.  HENT:     Head: Normocephalic and atraumatic.     Mouth/Throat:     Pharynx: Oropharynx is clear. No oropharyngeal exudate.  Eyes:     General: No scleral icterus.    Extraocular Movements: Extraocular movements intact.     Conjunctiva/sclera: Conjunctivae normal.     Pupils: Pupils are equal, round, and reactive to light.  Cardiovascular:     Rate and Rhythm: Normal rate and regular rhythm.     Pulses: Normal pulses.     Heart sounds: Normal heart sounds. No murmur heard.    No friction rub. No gallop.  Pulmonary:     Effort: Pulmonary effort is normal. No respiratory  distress.     Breath sounds: Normal breath sounds.  Chest:  Breasts:    Right: No swelling, bleeding, inverted nipple, mass, nipple discharge or skin change.     Left: No swelling, bleeding, inverted nipple, mass, nipple discharge or skin change.     Comments: Absent right nipple Abdominal:     General: Bowel sounds are normal. There is no distension.     Palpations: Abdomen is soft. There is no hepatomegaly, splenomegaly or mass.     Tenderness: There is no abdominal tenderness.  Musculoskeletal:        General: No tenderness. Normal range of motion.     Cervical back: Normal range of motion and neck supple.     Right lower leg: No edema.     Left lower leg: No edema.  Lymphadenopathy:     Cervical: No cervical adenopathy.     Right cervical: No superficial, deep or posterior cervical adenopathy.    Left cervical: No superficial, deep or posterior cervical adenopathy.     Upper Body:     Right upper body: No supraclavicular or axillary adenopathy.     Left upper body: No supraclavicular or axillary adenopathy.     Lower Body: No right inguinal adenopathy. No left inguinal adenopathy.  Skin:    General: Skin is warm and dry.     Coloration: Skin is not jaundiced.  Findings: No lesion or rash.  Neurological:     General: No focal deficit present.     Mental Status: She is alert and oriented to person, place, and time. Mental status is at baseline.  Psychiatric:        Mood and Affect: Mood normal.        Behavior: Behavior normal.        Thought Content: Thought content normal.        Judgment: Judgment normal.    ASSESSMENT & PLAN:  A 62 y.o. female with hormone positive ductal carcinoma in situ, status post a right breast lumpectomy in June 2023.  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  She also underwent adjuvant breast radiation.  Based upon her clinical breast exam today, the patient remains disease-free.   She knows to continue taking her tamoxifen on a daily basis for chemoprevention purposes as it pertains to her DCIS.  Clinically, the patient is doing very well.  I will see her back in 6 months for repeat clinical assessment.  Her breast MRI will be scheduled during this calendar month for her continued radiographic breast cancer surveillance.  Of note, a breast MRI is being ordered as it previously showed that she had areas of non-mass enhancement throughout her right breast that measured 6.4 x 6.1 cm.  This diffuse area of enhancement was never seen per her annual mammograms.  The patient understands all the plans discussed today and is in agreement with them.   Rayden Scheper Kirby Funk, MD

## 2023-05-29 ENCOUNTER — Inpatient Hospital Stay: Payer: BC Managed Care – PPO | Attending: Oncology | Admitting: Oncology

## 2023-05-29 ENCOUNTER — Other Ambulatory Visit: Payer: Self-pay | Admitting: Oncology

## 2023-05-29 ENCOUNTER — Telehealth: Payer: Self-pay | Admitting: Oncology

## 2023-05-29 VITALS — BP 123/70 | HR 72 | Temp 98.2°F | Resp 16 | Ht 64.0 in | Wt 178.2 lb

## 2023-05-29 DIAGNOSIS — D0512 Intraductal carcinoma in situ of left breast: Secondary | ICD-10-CM | POA: Diagnosis not present

## 2023-05-29 DIAGNOSIS — Z170 Estrogen receptor positive status [ER+]: Secondary | ICD-10-CM

## 2023-05-29 DIAGNOSIS — C50111 Malignant neoplasm of central portion of right female breast: Secondary | ICD-10-CM | POA: Diagnosis not present

## 2023-05-29 DIAGNOSIS — D0511 Intraductal carcinoma in situ of right breast: Secondary | ICD-10-CM

## 2023-05-29 DIAGNOSIS — Z79810 Long term (current) use of selective estrogen receptor modulators (SERMs): Secondary | ICD-10-CM | POA: Diagnosis not present

## 2023-05-29 NOTE — Telephone Encounter (Signed)
Patient has been scheduled for follow-up visit per 05/29/23 LOS.  Pt given an appt calendar with date and time.

## 2023-06-03 DIAGNOSIS — D0511 Intraductal carcinoma in situ of right breast: Secondary | ICD-10-CM | POA: Diagnosis not present

## 2023-06-03 DIAGNOSIS — Z170 Estrogen receptor positive status [ER+]: Secondary | ICD-10-CM | POA: Diagnosis not present

## 2023-06-03 DIAGNOSIS — C50411 Malignant neoplasm of upper-outer quadrant of right female breast: Secondary | ICD-10-CM | POA: Diagnosis not present

## 2023-07-22 DIAGNOSIS — F324 Major depressive disorder, single episode, in partial remission: Secondary | ICD-10-CM | POA: Diagnosis not present

## 2023-07-22 DIAGNOSIS — Z6831 Body mass index (BMI) 31.0-31.9, adult: Secondary | ICD-10-CM | POA: Diagnosis not present

## 2023-07-22 DIAGNOSIS — I82412 Acute embolism and thrombosis of left femoral vein: Secondary | ICD-10-CM | POA: Diagnosis not present

## 2023-09-25 DIAGNOSIS — Z01818 Encounter for other preprocedural examination: Secondary | ICD-10-CM | POA: Diagnosis not present

## 2023-10-27 NOTE — Addendum Note (Signed)
 Addended byShelbie Dess on: 10/27/2023 01:58 PM   Modules accepted: Orders

## 2023-11-05 DIAGNOSIS — K5790 Diverticulosis of intestine, part unspecified, without perforation or abscess without bleeding: Secondary | ICD-10-CM | POA: Diagnosis not present

## 2023-11-05 DIAGNOSIS — Z853 Personal history of malignant neoplasm of breast: Secondary | ICD-10-CM | POA: Diagnosis not present

## 2023-11-05 DIAGNOSIS — Z86718 Personal history of other venous thrombosis and embolism: Secondary | ICD-10-CM | POA: Diagnosis not present

## 2023-11-05 DIAGNOSIS — D120 Benign neoplasm of cecum: Secondary | ICD-10-CM | POA: Diagnosis not present

## 2023-11-05 DIAGNOSIS — Z86711 Personal history of pulmonary embolism: Secondary | ICD-10-CM | POA: Diagnosis not present

## 2023-11-05 DIAGNOSIS — K5140 Inflammatory polyps of colon without complications: Secondary | ICD-10-CM | POA: Diagnosis not present

## 2023-11-05 DIAGNOSIS — K5730 Diverticulosis of large intestine without perforation or abscess without bleeding: Secondary | ICD-10-CM | POA: Diagnosis not present

## 2023-11-05 DIAGNOSIS — K648 Other hemorrhoids: Secondary | ICD-10-CM | POA: Diagnosis not present

## 2023-11-05 DIAGNOSIS — Z860100 Personal history of colon polyps, unspecified: Secondary | ICD-10-CM | POA: Diagnosis not present

## 2023-11-05 DIAGNOSIS — D126 Benign neoplasm of colon, unspecified: Secondary | ICD-10-CM | POA: Diagnosis not present

## 2023-11-05 DIAGNOSIS — K644 Residual hemorrhoidal skin tags: Secondary | ICD-10-CM | POA: Diagnosis not present

## 2023-11-05 DIAGNOSIS — K635 Polyp of colon: Secondary | ICD-10-CM | POA: Diagnosis not present

## 2023-11-06 NOTE — Addendum Note (Signed)
 Addended by: Lindsy Cerullo M on: 11/06/2023 09:07 AM   Modules accepted: Orders

## 2023-11-25 NOTE — Progress Notes (Signed)
 [facility]  [address] [place],  [place]  [address] [phone]  Clinic Day:  11/26/2023  Referring physician: Nonda Bays, MD  HISTORY OF PRESENT ILLNESS:  The patient is a 63 y.o. 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  The patient completed her 1 year of adjuvant Perjeta /Herceptin  in July 2023.  She is currently taking tamoxifen  for her hormone positive DCIS.  She comes in today for routine follow-up.  Since her last visit, the patient has been doing well.  She denies having any particular changes in either breast which concern her for disease recurrence.  Of note, her annual mammogram in November 2024 showed no evidence of disease recurrence.  Her breast MRI is scheduled to be done in June 2025.  PHYSICAL EXAM:  Blood pressure 114/76, pulse (!) 55, temperature 99 F (37.2 C), temperature source Oral, resp. rate 18, height 5\' 4"  (1.626 m), weight 180 lb 9.6 oz (81.9 kg), last menstrual period 10/25/2015, SpO2 96%. Wt Readings from Last 3 Encounters:  11/26/23 180 lb 9.6 oz (81.9 kg)  05/29/23 178 lb 3.2 oz (80.8 kg)  11/07/22 170 lb 3.2 oz (77.2 kg)   Body mass index is 31 kg/m. Performance status (ECOG): 0 - Asymptomatic Physical Exam Constitutional:      General: She is not in acute distress.    Appearance: Normal appearance. She is normal weight.  HENT:     Head: Normocephalic and atraumatic.     Mouth/Throat:     Pharynx: Oropharynx is clear. No oropharyngeal exudate.  Eyes:     General: No scleral icterus.    Extraocular Movements: Extraocular movements intact.     Conjunctiva/sclera: Conjunctivae normal.     Pupils: Pupils are equal, round, and reactive to light.  Cardiovascular:     Rate and Rhythm: Normal rate and regular rhythm.      Pulses: Normal pulses.     Heart sounds: Normal heart sounds. No murmur heard.    No friction rub. No gallop.  Pulmonary:     Effort: Pulmonary effort is normal. No respiratory distress.     Breath sounds: Normal breath sounds.  Chest:  Breasts:    Right: No swelling, bleeding, inverted nipple, mass, nipple discharge or skin change.     Left: No swelling, bleeding, inverted nipple, mass, nipple discharge or skin change.     Comments: Absent right nipple Abdominal:     General: Bowel sounds are normal. There is no distension.     Palpations: Abdomen is soft. There is no hepatomegaly, splenomegaly or mass.     Tenderness: There is no abdominal tenderness.  Musculoskeletal:        General: No tenderness. Normal range of motion.     Cervical back: Normal range of motion and neck supple.     Right lower leg: No edema.     Left lower leg: No edema.  Lymphadenopathy:     Cervical: No cervical adenopathy.     Right cervical: No superficial, deep or posterior cervical adenopathy.    Left cervical: No superficial, deep or posterior cervical adenopathy.     Upper Body:     Right upper body: No supraclavicular or axillary adenopathy.     Left upper body: No supraclavicular or axillary adenopathy.  Lower Body: No right inguinal adenopathy. No left inguinal adenopathy.  Skin:    General: Skin is warm and dry.     Coloration: Skin is not jaundiced.     Findings: No lesion or rash.  Neurological:     General: No focal deficit present.     Mental Status: She is alert and oriented to person, place, and time. Mental status is at baseline.  Psychiatric:        Mood and Affect: Mood normal.        Behavior: Behavior normal.        Thought Content: Thought content normal.        Judgment: Judgment normal.    ASSESSMENT & PLAN:  A 63 y.o. female with hormone positive ductal carcinoma in situ, status post a right breast lumpectomy in June 2023.  She also stage IA (T1b N0 M0) Her2 positive  breast cancer, for which she completed one year of Herceptin /Perjeta  in July 2023.  She also underwent adjuvant breast radiation.  Based upon her clinical breast exam today, the patient remains disease-free.  She knows to continue taking her tamoxifen  on a daily basis for chemopreventive purposes, as it pertains to her DCIS.  Clinically, the patient is doing very well.  I will see her back in 6 months for repeat clinical assessment.  Both her breast MRI and annual mammogram will have been done before her next scheduled appointment.    The patient understands all the plans discussed today and is in agreement with them.   Yi Falletta Felicia Horde, MD

## 2023-11-26 ENCOUNTER — Other Ambulatory Visit: Payer: Self-pay | Admitting: Oncology

## 2023-11-26 ENCOUNTER — Other Ambulatory Visit: Payer: Self-pay

## 2023-11-26 ENCOUNTER — Inpatient Hospital Stay: Payer: BC Managed Care – PPO | Attending: Oncology | Admitting: Oncology

## 2023-11-26 VITALS — BP 114/76 | HR 55 | Temp 99.0°F | Resp 18 | Ht 64.0 in | Wt 180.6 lb

## 2023-11-26 DIAGNOSIS — D0511 Intraductal carcinoma in situ of right breast: Secondary | ICD-10-CM

## 2023-11-26 DIAGNOSIS — D0512 Intraductal carcinoma in situ of left breast: Secondary | ICD-10-CM | POA: Diagnosis not present

## 2023-11-26 DIAGNOSIS — Z79810 Long term (current) use of selective estrogen receptor modulators (SERMs): Secondary | ICD-10-CM | POA: Diagnosis not present

## 2023-11-26 DIAGNOSIS — C50111 Malignant neoplasm of central portion of right female breast: Secondary | ICD-10-CM | POA: Diagnosis not present

## 2023-11-26 DIAGNOSIS — Z170 Estrogen receptor positive status [ER+]: Secondary | ICD-10-CM

## 2023-11-26 DIAGNOSIS — F4321 Adjustment disorder with depressed mood: Secondary | ICD-10-CM | POA: Diagnosis not present

## 2023-12-25 ENCOUNTER — Ambulatory Visit
Admission: RE | Admit: 2023-12-25 | Discharge: 2023-12-25 | Disposition: A | Discharge status: Home / Self Care | Source: Ambulatory Visit | Attending: Oncology | Admitting: Oncology

## 2023-12-25 DIAGNOSIS — Z86000 Personal history of in-situ neoplasm of breast: Secondary | ICD-10-CM | POA: Diagnosis not present

## 2023-12-25 DIAGNOSIS — Z1239 Encounter for other screening for malignant neoplasm of breast: Secondary | ICD-10-CM | POA: Diagnosis not present

## 2023-12-25 DIAGNOSIS — D0511 Intraductal carcinoma in situ of right breast: Secondary | ICD-10-CM

## 2023-12-25 MED ORDER — GADOPICLENOL 0.5 MMOL/ML IV SOLN
8.0000 mL | Freq: Once | INTRAVENOUS | Status: AC | PRN
  Administered 2023-12-25: 8 mL via INTRAVENOUS

## 2024-04-22 DIAGNOSIS — Z136 Encounter for screening for cardiovascular disorders: Secondary | ICD-10-CM | POA: Diagnosis not present

## 2024-04-22 DIAGNOSIS — Z23 Encounter for immunization: Secondary | ICD-10-CM | POA: Diagnosis not present

## 2024-04-22 DIAGNOSIS — Z01419 Encounter for gynecological examination (general) (routine) without abnormal findings: Secondary | ICD-10-CM | POA: Diagnosis not present

## 2024-04-22 DIAGNOSIS — Z131 Encounter for screening for diabetes mellitus: Secondary | ICD-10-CM | POA: Diagnosis not present

## 2024-04-22 DIAGNOSIS — Z780 Asymptomatic menopausal state: Secondary | ICD-10-CM | POA: Diagnosis not present

## 2024-04-22 DIAGNOSIS — Z1339 Encounter for screening examination for other mental health and behavioral disorders: Secondary | ICD-10-CM | POA: Diagnosis not present

## 2024-04-22 DIAGNOSIS — Z1331 Encounter for screening for depression: Secondary | ICD-10-CM | POA: Diagnosis not present

## 2024-05-13 DIAGNOSIS — R92323 Mammographic fibroglandular density, bilateral breasts: Secondary | ICD-10-CM | POA: Diagnosis not present

## 2024-05-13 DIAGNOSIS — Z170 Estrogen receptor positive status [ER+]: Secondary | ICD-10-CM | POA: Diagnosis not present

## 2024-05-13 DIAGNOSIS — C50411 Malignant neoplasm of upper-outer quadrant of right female breast: Secondary | ICD-10-CM | POA: Diagnosis not present

## 2024-05-13 DIAGNOSIS — C50412 Malignant neoplasm of upper-outer quadrant of left female breast: Secondary | ICD-10-CM | POA: Diagnosis not present

## 2024-05-21 DIAGNOSIS — Z170 Estrogen receptor positive status [ER+]: Secondary | ICD-10-CM | POA: Diagnosis not present

## 2024-05-21 DIAGNOSIS — D0511 Intraductal carcinoma in situ of right breast: Secondary | ICD-10-CM | POA: Diagnosis not present

## 2024-05-21 DIAGNOSIS — C50411 Malignant neoplasm of upper-outer quadrant of right female breast: Secondary | ICD-10-CM | POA: Diagnosis not present

## 2024-05-27 NOTE — Progress Notes (Signed)
 [facility] [address] [place],  [place]  [address] [phone]  Clinic Day:  05/28/2024  Referring physician: Lynwood Search, MD  HISTORY OF PRESENT ILLNESS:  The patient is a 63 y.o. 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  The patient completed her 1 year of adjuvant Perjeta /Herceptin  in July 2023.  She is currently taking tamoxifen  for her hormone positive DCIS.  She comes in today for routine follow-up.  Since her last visit, the patient has been doing well.  She denies having any particular changes in either breast which concern her for disease recurrence.  Of note, her annual mammogram in November 2025 showed no evidence of disease recurrence.    PHYSICAL EXAM:  Blood pressure 123/72, pulse 75, temperature 98.3 F (36.8 C), temperature source Oral, resp. rate 14, height 5' 4 (1.626 m), weight 184 lb 11.2 oz (83.8 kg), last menstrual period 10/25/2015, SpO2 95%. Wt Readings from Last 3 Encounters:  05/28/24 184 lb 11.2 oz (83.8 kg)  11/26/23 180 lb 9.6 oz (81.9 kg)  05/29/23 178 lb 3.2 oz (80.8 kg)   Body mass index is 31.7 kg/m. Performance status (ECOG): 0 - Asymptomatic Physical Exam Constitutional:      General: She is not in acute distress.    Appearance: Normal appearance. She is normal weight.  HENT:     Head: Normocephalic and atraumatic.     Mouth/Throat:     Pharynx: Oropharynx is clear. No oropharyngeal exudate.  Eyes:     General: No scleral icterus.    Extraocular Movements: Extraocular movements intact.     Conjunctiva/sclera: Conjunctivae normal.     Pupils: Pupils are equal, round, and reactive to light.  Cardiovascular:     Rate and Rhythm: Normal rate and regular rhythm.     Pulses: Normal pulses.     Heart sounds: Normal  heart sounds. No murmur heard.    No friction rub. No gallop.  Pulmonary:     Effort: Pulmonary effort is normal. No respiratory distress.     Breath sounds: Normal breath sounds.  Chest:  Breasts:    Right: No swelling, bleeding, inverted nipple, mass, nipple discharge or skin change.     Left: No swelling, bleeding, inverted nipple, mass, nipple discharge or skin change.     Comments: Absent right nipple Abdominal:     General: Bowel sounds are normal. There is no distension.     Palpations: Abdomen is soft. There is no hepatomegaly, splenomegaly or mass.     Tenderness: There is no abdominal tenderness.  Musculoskeletal:        General: No tenderness. Normal range of motion.     Cervical back: Normal range of motion and neck supple.     Right lower leg: No edema.     Left lower leg: No edema.  Lymphadenopathy:     Cervical: No cervical adenopathy.     Right cervical: No superficial, deep or posterior cervical adenopathy.    Left cervical: No superficial, deep or posterior cervical adenopathy.     Upper Body:     Right upper body: No supraclavicular or axillary adenopathy.     Left upper body: No supraclavicular or axillary adenopathy.     Lower Body: No right inguinal adenopathy. No  left inguinal adenopathy.  Skin:    General: Skin is warm and dry.     Coloration: Skin is not jaundiced.     Findings: No lesion or rash.  Neurological:     General: No focal deficit present.     Mental Status: She is alert and oriented to person, place, and time. Mental status is at baseline.  Psychiatric:        Mood and Affect: Mood normal.        Behavior: Behavior normal.        Thought Content: Thought content normal.        Judgment: Judgment normal.    ASSESSMENT & PLAN:  A 63 y.o. female with hormone positive ductal carcinoma in situ, status post a right breast lumpectomy in June 2023.  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  She also underwent adjuvant breast radiation.  Based upon her clinical breast exam today, the patient remains disease-free.  She knows to continue taking her tamoxifen  on a daily basis for chemopreventive purposes, as it pertains to her DCIS.  Clinically, the patient is doing very well.  I will see her back in 6 months for repeat clinical assessment.  The patient understands all the plans discussed today and is in agreement with them.   Nekoda Chock DELENA Kerns, MD

## 2024-05-28 ENCOUNTER — Inpatient Hospital Stay: Attending: Oncology | Admitting: Oncology

## 2024-05-28 ENCOUNTER — Telehealth: Payer: Self-pay | Admitting: Oncology

## 2024-05-28 VITALS — BP 123/72 | HR 75 | Temp 98.3°F | Resp 14 | Ht 64.0 in | Wt 184.7 lb

## 2024-05-28 DIAGNOSIS — D0512 Intraductal carcinoma in situ of left breast: Secondary | ICD-10-CM | POA: Diagnosis not present

## 2024-05-28 DIAGNOSIS — Z79810 Long term (current) use of selective estrogen receptor modulators (SERMs): Secondary | ICD-10-CM | POA: Diagnosis not present

## 2024-05-28 NOTE — Telephone Encounter (Signed)
 Patient has been scheduled for follow-up visit per 05/28/2024 LOS.  Pt given an appt calendar with date and time.

## 2024-11-25 ENCOUNTER — Inpatient Hospital Stay: Admitting: Oncology
